# Patient Record
Sex: Male | Born: 1990 | Race: Black or African American | Hispanic: No | Marital: Single | Smoking: Current some day smoker
Health system: Southern US, Community
[De-identification: ages and names within clinical notes are randomized; demographics above are authoritative.]

## PROBLEM LIST (undated history)

## (undated) DIAGNOSIS — I10 Essential (primary) hypertension: Secondary | ICD-10-CM

## (undated) DIAGNOSIS — Z87891 Personal history of nicotine dependence: Secondary | ICD-10-CM

## (undated) HISTORY — DX: Personal history of nicotine dependence: Z87.891

---
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
## 2010-07-24 LAB — URINALYSIS, ROUTINE W REFLEX MICROSCOPIC
Bilirubin Urine: NEGATIVE
Glucose, UA: NEGATIVE mg/dL
Hgb urine dipstick: NEGATIVE
Ketones, ur: NEGATIVE mg/dL
Nitrite: NEGATIVE
Protein, ur: NEGATIVE mg/dL
Specific Gravity, Urine: 1.034 — ABNORMAL HIGH (ref 1.005–1.030)
Urobilinogen, UA: 0.2 mg/dL (ref 0.0–1.0)
pH: 5 (ref 5.0–8.0)

## 2010-07-24 LAB — CBC
HCT: 48.8 % (ref 39.0–52.0)
MCHC: 33.1 g/dL (ref 30.0–36.0)
MCV: 82.1 fL (ref 78.0–100.0)
RBC: 5.95 MIL/uL — ABNORMAL HIGH (ref 4.22–5.81)
WBC: 11.6 10*3/uL — ABNORMAL HIGH (ref 4.0–10.5)

## 2010-07-24 LAB — DIFFERENTIAL
Basophils Absolute: 0 10*3/uL (ref 0.0–0.1)
Eosinophils Relative: 0 % (ref 0–5)
Lymphocytes Relative: 4 % — ABNORMAL LOW (ref 12–46)
Neutro Abs: 10.7 10*3/uL — ABNORMAL HIGH (ref 1.7–7.7)
Neutrophils Relative %: 92 % — ABNORMAL HIGH (ref 43–77)

## 2010-07-24 LAB — LIPASE, BLOOD: Lipase: 14 U/L (ref 11–59)

## 2010-07-24 LAB — COMPREHENSIVE METABOLIC PANEL
BUN: 14 mg/dL (ref 6–23)
CO2: 27 mEq/L (ref 19–32)
Calcium: 9.3 mg/dL (ref 8.4–10.5)
Chloride: 102 mEq/L (ref 96–112)
Creatinine, Ser: 1.1 mg/dL (ref 0.4–1.5)
GFR calc non Af Amer: >60 mL/min (ref >60)
Glucose, Bld: 111 mg/dL — ABNORMAL HIGH (ref 70–99)
Total Bilirubin: 1.6 mg/dL — ABNORMAL HIGH (ref 0.3–1.2)

## 2010-07-26 LAB — POCT RAPID STREP A (OFFICE): Streptococcus, Group A Screen (Direct): NEGATIVE

## 2010-07-30 LAB — POCT RAPID STREP A (OFFICE): Streptococcus, Group A Screen (Direct): NEGATIVE

## 2010-09-24 NOTE — Op Note (Signed)
NAME:  HARLIS, CHAMPOUX       ACCOUNT NO.:  [id number]   MEDICAL RECORD NO.:  [id number]          PATIENT TYPE:  OBV   LOCATION:  2550                         FACILITY:  [facility]   PHYSICIAN:  Burnard Bunting, M.D.    DATE OF BIRTH:  August 26, 1990   DATE OF PROCEDURE:  07/08/2005  DATE OF DISCHARGE:                                 OPERATIVE REPORT   PREOPERATIVE DIAGNOSIS:  Left wrist Salter-Harris 3 fracture.   POSTOPERATIVE DIAGNOSIS:  Left wrist Salter-Harris 3 fracture.   PROCEDURE:  Left wrist closed reduction with percutaneous pinning.   SURGEON:  Burnard Bunting, M.D.   ASSISTANT:  None.   ANESTHESIA:  General.   ESTIMATED BLOOD LOSS:  2 cc.   DESCRIPTION OF PROCEDURE:  The patient was brought to the operating room  where general endotracheal anesthesia was induced and preoperative  antibiotics were administered.  The left arm was prepped with DuraPrep  solution and draped in a sterile manner.   Under one attempt, the distal radius fracture was reduced.  One 0.62 K-wire  was placed across the fracture site on a single attempt, with good reduction  achieved and confirmed in the AP and lateral planes under fluoroscopic  guidance.  The incision for the pin was made, and soft tissue dissection was  performed with the hemostat spreader.  The pin was then bent and cut above  the skin.  Bactroban cream was applied over the pin entry site.  A bulky  dressing and sugartong splint was applied.   The patient tolerated the procedure well, without immediate complications.           ______________________________  G. Dorene Grebe, M.D.     GSD/MEDQ  D:  07/08/2005  T:  07/10/2005  Job:  [id number]

## 2010-09-24 NOTE — H&P (Signed)
NAME:  Jesse, Reed       ACCOUNT NO.:  [id number]   MEDICAL RECORD NO.:  [id number]          PATIENT TYPE:  OBV   LOCATION:  2550                         FACILITY:  [facility]   PHYSICIAN:  Burnard Bunting, M.D.    DATE OF BIRTH:  1990/09/11   DATE OF ADMISSION:  07/08/2005  DATE OF DISCHARGE:  07/09/2005                                HISTORY & PHYSICAL   CHIEF COMPLAINT:  Left wrist pain.   HISTORY OF PRESENT ILLNESS:  Jesse Reed is a 20 year old right  hand dominant male who fell on his outstretched hand playing football today.  He reports left wrist pain.  Denies any other orthopedic complaints.  He has  been NPO since 3:45.   He has no known drug allergies.   No current medications.   PAST HISTORY:  Noncontributory.   He has a noncontributory family history.   No loss of consciousness, no problems with anesthesia in the past.   PHYSICAL EXAMINATION:  VITAL SIGNS:  Stable.  CHEST:  Clear to auscultation.  HEART:  Regular rhythm.  ABDOMEN:  Benign.  EXTREMITIES:  The patient does have increased body mass index.  Left elbow  and shoulder have full range of motion.  The left wrist compartments are  soft.  Radial pulse 2+ out of 4.  Sensation __________ in medial radial  ulnar distribution.  He does have decreased grip strength.   Radiographs demonstrate Salter-Harris III distal radius fracture.   IMPRESSION:  Left distal radius fracture.   PLAN:  Closed reduction, percutaneous pinning.  Risks and benefits are  discussed with the mother which include, but are not limited to, infection,  nonunion, malunion, growth plate disturbance, nerve and vessel damage, and  the need for more surgery.  All questions answered.           ______________________________  G. Dorene Grebe, M.D.     GSD/MEDQ  D:  07/08/2005  T:  07/09/2005  Job:  [id number]

## 2011-11-30 ENCOUNTER — Encounter (HOSPITAL_COMMUNITY): Payer: Self-pay | Admitting: Emergency Medicine

## 2011-11-30 ENCOUNTER — Emergency Department (HOSPITAL_COMMUNITY)
Admission: EM | Admit: 2011-11-30 | Discharge: 2011-11-30 | Disposition: A | Discharge status: Home / Self Care | Payer: Self-pay | Attending: Emergency Medicine | Admitting: Emergency Medicine

## 2011-11-30 DIAGNOSIS — F17200 Nicotine dependence, unspecified, uncomplicated: Secondary | ICD-10-CM | POA: Insufficient documentation

## 2011-11-30 DIAGNOSIS — J039 Acute tonsillitis, unspecified: Secondary | ICD-10-CM | POA: Insufficient documentation

## 2011-11-30 MED ORDER — HYDROCODONE-HOMATROPINE 5-1.5 MG/5ML PO SYRP: 5.0000 mL | ORAL_SOLUTION | Freq: Four times a day (QID) | ORAL | Status: AC | PRN

## 2011-11-30 MED ORDER — DEXAMETHASONE SODIUM PHOSPHATE 10 MG/ML IJ SOLN
10.0000 mg | Freq: Once | INTRAMUSCULAR | Status: AC
  Administered 2011-11-30: 10 mg via INTRAMUSCULAR
  Filled 2011-11-30: qty 1

## 2011-11-30 MED ORDER — PENICILLIN G BENZATHINE 1200000 UNIT/2ML IM SUSP
1.2000 10*6.[IU] | Freq: Once | INTRAMUSCULAR | Status: AC
  Administered 2011-11-30: 1.2 10*6.[IU] via INTRAMUSCULAR
  Filled 2011-11-30: qty 2

## 2011-11-30 NOTE — ED Notes (Signed)
pt reports sore throat and R ear pain onset this weekend

## 2011-11-30 NOTE — ED Provider Notes (Signed)
Medical screening examination/treatment/procedure(s) were performed by non-physician practitioner and as supervising physician I was immediately available for consultation/collaboration.  Sunnie Nielsen, MD 11/30/11 [phone]

## 2011-11-30 NOTE — ED Provider Notes (Signed)
History     CSN: [id number]  Arrival date & time 11/30/11  1478   First MD Initiated Contact with Patient 11/30/11 0522      Chief Complaint  Patient presents with  . Sore Throat  . Otalgia    (Consider location/radiation/quality/duration/timing/severity/associated sxs/prior treatment) HPI Comments: Patient presents emergency department with chief complaint of sore throat.  Onset of symptoms began yesterday and are interfering with the patient's ability to sleep tonight.  In addition he reports some right ear pain but denies any cough, fever, night sweats, chills, difficulty breathing, throat closing, inability to swallow secretions, voice change, stridor or drooling.  Pain severity is rated at a 7/10 and worsened by swallowing.  Patient is a 21 y.o. male presenting with pharyngitis and ear pain. The history is provided by the patient.  Sore Throat Associated symptoms include a sore throat. Pertinent negatives include no abdominal pain, chest pain, chills, congestion, coughing, diaphoresis, fatigue, fever, headaches, neck pain or rash.  Otalgia Associated symptoms include sore throat. Pertinent negatives include no headaches, no rhinorrhea, no abdominal pain, no neck pain, no cough and no rash.    History reviewed. No pertinent past medical history.  History reviewed. No pertinent past surgical history.  History reviewed. No pertinent family history.  History  Substance Use Topics  . Smoking status: Current Some Day Smoker    Types: Cigars  . Smokeless tobacco: Not on file  . Alcohol Use: No      Review of Systems  Constitutional: Negative for fever, chills, diaphoresis, activity change, appetite change and fatigue.  HENT: Positive for ear pain, sore throat and trouble swallowing. Negative for congestion, facial swelling, rhinorrhea, sneezing, drooling, neck pain, neck stiffness, dental problem, voice change, postnasal drip and sinus pressure.   Eyes: Positive for visual  disturbance.  Respiratory: Negative for cough, choking, shortness of breath, wheezing and stridor.   Cardiovascular: Negative for chest pain.  Gastrointestinal: Negative for abdominal pain.  Skin: Negative for rash.  Neurological: Negative for dizziness and headaches.  Hematological: Positive for adenopathy. Does not bruise/bleed easily.    Allergies  Review of patient's allergies indicates no known allergies.  Home Medications   Current Outpatient Rx  Name Route Sig Dispense Refill  . ADULT MULTIVITAMIN W/MINERALS CH Oral Take 1 tablet by mouth daily.      BP 175/97  Pulse 99  Temp 100.1 F (37.8 C) (Oral)  Resp 17  SpO2 97%  Physical Exam  Nursing note and vitals reviewed. Constitutional: He is oriented to person, place, and time. He appears well-developed and well-nourished. No distress.  HENT:  Head: Normocephalic and atraumatic. No trismus in the jaw.  Right Ear: Tympanic membrane, external ear and ear canal normal.  Left Ear: Tympanic membrane, external ear and ear canal normal.  Nose: Nose normal. No rhinorrhea. Right sinus exhibits no maxillary sinus tenderness and no frontal sinus tenderness. Left sinus exhibits no maxillary sinus tenderness and no frontal sinus tenderness.  Mouth/Throat: Uvula is midline and mucous membranes are normal. Normal dentition. No dental abscesses or uvula swelling. Posterior oropharyngeal edema present. No oropharyngeal exudate, posterior oropharyngeal erythema or tonsillar abscesses.       No submental edema, tongue not elevated, no trismus. No impending airway obstruction; Pt able to speak full sentences, swallow intact, no drooling, stridor, or tonsillar/uvula displacement. No palatal petechia  Eyes: Conjunctivae are normal.  Neck: Trachea normal, normal range of motion and full passive range of motion without pain. Neck supple. No rigidity. Erythema  present. Normal range of motion present. No Brudzinski's sign noted.       Flexion and  extension of neck without pain or difficulty. Able to breath without difficulty in extension.  Cardiovascular: Normal rate and regular rhythm.   Pulmonary/Chest: Effort normal and breath sounds normal. No stridor. No respiratory distress. He has no wheezes.  Abdominal: Soft. There is no tenderness.       No obvious evidence of splenomegaly. Non ttp.   Musculoskeletal: Normal range of motion.  Lymphadenopathy:       Head (right side): No preauricular and no posterior auricular adenopathy present.       Head (left side): No preauricular and no posterior auricular adenopathy present.    He has cervical adenopathy.  Neurological: He is alert and oriented to person, place, and time.  Skin: Skin is warm and dry. No rash noted. He is not diaphoretic.  Psychiatric: He has a normal mood and affect.    ED Course  Procedures (including critical care time)  Labs Reviewed - No data to display No results found.   No diagnosis found.    MDM  Tonsillitis  Patient she did with Decadron and penicillin while in the emergency department.  He'll be discharged with symptomatic treatment. Pt afebrile without tonsillar exudate, treated with pcn. Presents with mild cervical lymphadenopathy, & dysphagia. DC w symptomatic tx for pain  Pt does not appear dehydrated, but did discuss importance of water rehydration. Presentation non concerning for PTA or infxn spread to soft tissue. No trismus or uvula deviation. Specific return precautions discussed. Pt able to drink water in ED without difficulty with intact air way. Recommended PCP follow up.         Jaci Carrel, [place] 11/30/11 [phone]

## 2013-04-15 ENCOUNTER — Encounter (HOSPITAL_COMMUNITY): Payer: Self-pay | Admitting: Emergency Medicine

## 2013-04-15 ENCOUNTER — Emergency Department (INDEPENDENT_AMBULATORY_CARE_PROVIDER_SITE_OTHER)
Admission: EM | Admit: 2013-04-15 | Discharge: 2013-04-15 | Disposition: A | Discharge status: Home / Self Care | Payer: 59 | Source: Home / Self Care | Attending: Family Medicine | Admitting: Family Medicine

## 2013-04-15 DIAGNOSIS — S60229A Contusion of unspecified hand, initial encounter: Secondary | ICD-10-CM

## 2013-04-15 DIAGNOSIS — S60221A Contusion of right hand, initial encounter: Secondary | ICD-10-CM

## 2013-04-15 NOTE — ED Provider Notes (Signed)
CSN: [id number]     Arrival date & time 04/15/13  1948 History   First MD Initiated Contact with Patient 04/15/13 2014     Chief Complaint  Patient presents with  . Hand Pain   (Consider location/radiation/quality/duration/timing/severity/associated sxs/prior Treatment) Patient is a 22 y.o. male presenting with hand pain. The history is provided by the patient.  Hand Pain This is a new problem. The current episode started 2 days ago. The problem has not changed since onset.Associated symptoms comments: Lifts heavy furniture at work, is right handed, and feels hand is sore from work activity. NKI.Marland Kitchen    History reviewed. No pertinent past medical history. History reviewed. No pertinent past surgical history. No family history on file. History  Substance Use Topics  . Smoking status: Current Some Day Smoker    Types: Cigars  . Smokeless tobacco: Not on file  . Alcohol Use: No    Review of Systems  Constitutional: Negative.   Musculoskeletal: Positive for myalgias. Negative for arthralgias, back pain and joint swelling.  Skin: Negative for rash and wound.    Allergies  Review of patient's allergies indicates no known allergies.  Home Medications   Current Outpatient Rx  Name  Route  Sig  Dispense  Refill  . Multiple Vitamin (MULTIVITAMIN WITH MINERALS) TABS   Oral   Take 1 tablet by mouth daily.          BP 137/77  Pulse 86  Temp(Src) 97.8 F (36.6 C) (Oral)  Resp 14  SpO2 98% Physical Exam  Nursing note and vitals reviewed. Constitutional: He is oriented to person, place, and time. He appears well-developed and well-nourished.  Musculoskeletal: He exhibits tenderness.       Right hand: He exhibits tenderness. He exhibits normal range of motion, no bony tenderness, normal two-point discrimination, normal capillary refill, no deformity and no swelling.       Hands: Neurological: He is alert and oriented to person, place, and time.  Skin: Skin is warm and dry.     ED Course  Procedures (including critical care time) Labs Review Labs Reviewed - No data to display Imaging Review No results found.  EKG Interpretation    Date/Time:    Ventricular Rate:    PR Interval:    QRS Duration:   QT Interval:    QTC Calculation:   R Axis:     Text Interpretation:              MDM   1. Contusion, hand, right, initial encounter        Linna Hoff, MD 04/15/13 2028

## 2013-04-15 NOTE — ED Notes (Signed)
Right hand pain that started 2 days ago.  Reports sharp pain, no known injury.  Pain is in the middle of the palm of the hand

## 2014-01-05 ENCOUNTER — Emergency Department (HOSPITAL_COMMUNITY): Payer: 59

## 2014-01-05 ENCOUNTER — Encounter (HOSPITAL_COMMUNITY): Payer: Self-pay | Admitting: Emergency Medicine

## 2014-01-05 ENCOUNTER — Emergency Department (HOSPITAL_COMMUNITY)
Admission: EM | Admit: 2014-01-05 | Discharge: 2014-01-05 | Disposition: A | Discharge status: Home / Self Care | Payer: 59 | Attending: Emergency Medicine | Admitting: Emergency Medicine

## 2014-01-05 DIAGNOSIS — S60042A Contusion of left ring finger without damage to nail, initial encounter: Secondary | ICD-10-CM

## 2014-01-05 DIAGNOSIS — S6980XA Other specified injuries of unspecified wrist, hand and finger(s), initial encounter: Secondary | ICD-10-CM | POA: Diagnosis present

## 2014-01-05 DIAGNOSIS — Y9361 Activity, american tackle football: Secondary | ICD-10-CM | POA: Insufficient documentation

## 2014-01-05 DIAGNOSIS — W219XXA Striking against or struck by unspecified sports equipment, initial encounter: Secondary | ICD-10-CM | POA: Insufficient documentation

## 2014-01-05 DIAGNOSIS — S6000XA Contusion of unspecified finger without damage to nail, initial encounter: Secondary | ICD-10-CM | POA: Diagnosis not present

## 2014-01-05 DIAGNOSIS — Y92838 Other recreation area as the place of occurrence of the external cause: Secondary | ICD-10-CM

## 2014-01-05 DIAGNOSIS — F17200 Nicotine dependence, unspecified, uncomplicated: Secondary | ICD-10-CM | POA: Diagnosis not present

## 2014-01-05 DIAGNOSIS — S6990XA Unspecified injury of unspecified wrist, hand and finger(s), initial encounter: Secondary | ICD-10-CM | POA: Insufficient documentation

## 2014-01-05 DIAGNOSIS — Y9239 Other specified sports and athletic area as the place of occurrence of the external cause: Secondary | ICD-10-CM | POA: Diagnosis not present

## 2014-01-05 MED ORDER — HYDROCODONE-ACETAMINOPHEN 5-325 MG PO TABS: 1.0000 | ORAL_TABLET | Freq: Four times a day (QID) | ORAL | Status: DC | PRN

## 2014-01-05 MED ORDER — NAPROXEN 500 MG PO TABS: 500.0000 mg | ORAL_TABLET | Freq: Two times a day (BID) | ORAL | Status: DC | PRN

## 2014-01-05 NOTE — ED Provider Notes (Signed)
Medical screening examination/treatment/procedure(s) were performed by non-physician practitioner and as supervising physician I was immediately available for consultation/collaboration.   EKG Interpretation None        Shantika Bermea J. Alexza Norbeck, MD 01/05/14 2336 

## 2014-01-05 NOTE — ED Provider Notes (Signed)
CSN: [id number]     Arrival date & time 01/05/14  1500 History  This chart was scribed for Jesse Gourd, PA-C, working with Gilda Crease, * by Chestine Spore, ED Scribe. The patient was seen in room TR06C/TR06C at 3:15 PM.    Chief Complaint  Patient presents with  . Finger Injury     The history is provided by the patient. No language interpreter was used.   HPI Comments: Jesse Reed is a 23 y.o. male who presents to the Emergency Department complaining of a left ring finger injury onset yesterday. He rates his pain as a 7/10.He states that someone stepped on his right hand while playing football. He does not know if he fractured it. He can not sleep because of the pain. Pt is able to pick things up but it hurts with movement. He is not able to bend his finger. He applied tape to his finger "just because." He denies any other associated symptoms.  History reviewed. No pertinent past medical history. History reviewed. No pertinent past surgical history. History reviewed. No pertinent family history. History  Substance Use Topics  . Smoking status: Current Some Day Smoker    Types: Cigars  . Smokeless tobacco: Not on file  . Alcohol Use: No    Review of Systems  Musculoskeletal: Positive for arthralgias (left ring finger).    A complete 10 system review of systems was obtained and all systems are negative except as noted in the HPI and PMH.    Allergies  Review of patient's allergies indicates no known allergies.  Home Medications   Prior to Admission medications   Medication Sig Start Date End Date Taking? Authorizing Provider  HYDROcodone-acetaminophen (NORCO) 5-325 MG per tablet Take 1-2 tablets by mouth every 6 (six) hours as needed for severe pain. 01/05/14   Mercedes Strupp Camprubi-Soms, PA-C  naproxen (NAPROSYN) 500 MG tablet Take 1 tablet (500 mg total) by mouth 2 (two) times daily as needed for mild pain, moderate pain or headache (TAKE WITH MEALS.).  01/05/14   Mercedes Strupp Camprubi-Soms, PA-C   BP 153/77  Pulse 72  Temp(Src) 98.4 F (36.9 C) (Oral)  Resp 18  SpO2 99%  Physical Exam  Nursing note and vitals reviewed. Constitutional: He is oriented to person, place, and time. He appears well-developed and well-nourished. No distress.  HENT:  Head: Normocephalic and atraumatic.  Eyes: Conjunctivae and EOM are normal.  Neck: Normal range of motion. Neck supple.  Cardiovascular: Normal rate, regular rhythm and normal heart sounds.   Pulmonary/Chest: Effort normal and breath sounds normal.  Musculoskeletal: Normal range of motion. He exhibits tenderness. He exhibits no edema.  Tender to palpation over left ring finger PIP with mild swelling. Full flexion and extension at PIP joint. No evidence of tendon disruption. Cap refill less than 3 seconds.   Neurological: He is alert and oriented to person, place, and time.  Skin: Skin is warm and dry.  Psychiatric: He has a normal mood and affect. His behavior is normal.    ED Course  Procedures (including critical care time) DIAGNOSTIC STUDIES: Oxygen Saturation is 99% on room air, normal by my interpretation.    COORDINATION OF CARE: 3:19 PM-Discussed treatment plan which includes X-ray of left ring finger with pt at bedside and pt agreed to plan.   Labs Review Labs Reviewed - No data to display  Imaging Review No results found.   EKG Interpretation None      MDM   Final diagnoses:  Contusion of left ring finger, initial encounter    Neurovascularly intact. Xray without acute findings. RICE, NSAIDs. Stable for d/c. Return precautions given. Patient states understanding of treatment care plan and is agreeable.  I personally performed the services described in this documentation, which was scribed in my presence. The recorded information has been reviewed and is accurate.    Trevor Mace, PA-C 01/21/14 [phone]

## 2014-01-05 NOTE — Discharge Instructions (Signed)
Use ice for at a time over the next 24hrs, then move to heat. Buddy tape your finger for activities as needed to help with pain. Use naprosyn and norco as needed for pain. Return to your regular doctor in 1-2 weeks for reassessment. Return to the ER for any changes or worsening symptoms.   Contusion A contusion is a deep bruise. Contusions happen when an injury causes bleeding under the skin. Signs of bruising include pain, puffiness (swelling), and discolored skin. The contusion may turn blue, purple, or yellow. HOME CARE   Put ice on the injured area.  Put ice in a plastic bag.  Place a towel between your skin and the bag.  Leave the ice on for 15-20 minutes, 03-04 times a day.  Only take medicine as told by your doctor.  Rest the injured area.  If possible, raise (elevate) the injured area to lessen puffiness. GET HELP RIGHT AWAY IF:   You have more bruising or puffiness.  You have pain that is getting worse.  Your puffiness or pain is not helped by medicine. MAKE SURE YOU:   Understand these instructions.  Will watch your condition.  Will get help right away if you are not doing well or get worse. Document Released: 10/12/2007 Document Revised: 07/18/2011 Document Reviewed: 02/28/2011 [facility] Patient Information 2015 [place], [place]. This information is not intended to replace advice given to you by your health care provider. Make sure you discuss any questions you have with your health care provider.  Cryotherapy Cryotherapy is when you put ice on your injury. Ice helps lessen pain and puffiness (swelling) after an injury. Ice works the best when you start using it in the first 24 to 48 hours after an injury. HOME CARE  Put a dry or damp towel between the ice pack and your skin.  You may press gently on the ice pack.  Leave the ice on for no more than 10 to 20 minutes at a time.  Check your skin after 5 minutes to make sure your skin is okay.  Rest at  least 20 minutes between ice pack uses.  Stop using ice when your skin loses feeling (numbness).  Do not use ice on someone who cannot tell you when it hurts. This includes small children and people with memory problems (dementia). GET HELP RIGHT AWAY IF:  You have white spots on your skin.  Your skin turns blue or pale.  Your skin feels waxy or hard.  Your puffiness gets worse. MAKE SURE YOU:   Understand these instructions.  Will watch your condition.  Will get help right away if you are not doing well or get worse. Document Released: 10/12/2007 Document Revised: 07/18/2011 Document Reviewed: 12/16/2010 [facility] Patient Information 2015 [place], [place]. This information is not intended to replace advice given to you by your health care provider. Make sure you discuss any questions you have with your health care provider.

## 2014-01-05 NOTE — ED Provider Notes (Signed)
Care assumed from [facility]. Xray pending Physical Exam  BP 153/77  Pulse 72  Temp(Src) 98.4 F (36.9 C) (Oral)  Resp 18  SpO2 99%  Physical Exam Gen: afebrile, VSS, NAD HEENT: EOMI, MMM Resp: no resp distress CV: rate WNL Abd: appearance normal, nondistended MsK: moving all extremities with ease, somewhat limited ROM at PIP of L 4th finger secondary to pain with full extension but not as much with full flexion, with slight crepitus upon passive full extension but minimally tender and no effusion or swelling. NV intact Neuro: A&O x4  ED Course  Procedures Dg Finger Ring Left  01/05/2014   CLINICAL DATA:  Injury.  EXAM: LEFT RING FINGER 2+V  COMPARISON:  None.  FINDINGS: No radiopaque foreign bodies. No soft tissue swelling. No acute bony or joint abnormality.  IMPRESSION: No acute abnormality .   Electronically Signed   By: Maisie Fus  Register   On: 01/05/2014 16:15     MDM No fx, neurovascularly intact digit. Buddy taping for activities, discussed RICE therapy and gradual return to regular sports. F/up with PCP in 2 wks. Rx for naprosyn and percocet given. I explained the diagnosis and have given explicit precautions to return to the ER including for any other new or worsening symptoms. The patient understands and accepts the medical plan as it's been dictated and I have answered their questions. Discharge instructions concerning home care and prescriptions have been given. The patient is STABLE and is discharged to home in good condition.  BP 153/77  Pulse 72  Temp(Src) 98.4 F (36.9 C) (Oral)  Resp 18  SpO2 99%  Meds ordered this encounter  Medications  . HYDROcodone-acetaminophen (NORCO) 5-325 MG per tablet    Sig: Take 1-2 tablets by mouth every 6 (six) hours as needed for severe pain.    Dispense:  6 tablet    Refill:  0    Order Specific Question:  Supervising Provider    Answer:  Eber Hong D [[id number]]  . naproxen (NAPROSYN) 500 MG tablet    Sig: Take 1 tablet  (500 mg total) by mouth 2 (two) times daily as needed for mild pain, moderate pain or headache (TAKE WITH MEALS.).    Dispense:  20 tablet    Refill:  0    Order Specific Question:  Supervising Provider    Answer:  Vida Roller [address] Camprubi-Soms, PA-C 01/05/14 [phone]

## 2014-01-05 NOTE — ED Notes (Signed)
Declined W/C at D/C and was escorted to lobby by RN. 

## 2014-01-05 NOTE — ED Notes (Signed)
Pt reports having someone step on his right hand yesterday while playing football. Has pain to right middle finger.

## 2014-01-15 ENCOUNTER — Emergency Department (HOSPITAL_COMMUNITY)
Admission: EM | Admit: 2014-01-15 | Discharge: 2014-01-15 | Disposition: A | Discharge status: Home / Self Care | Payer: 59 | Attending: Emergency Medicine | Admitting: Emergency Medicine

## 2014-01-15 ENCOUNTER — Encounter (HOSPITAL_COMMUNITY): Payer: Self-pay | Admitting: Emergency Medicine

## 2014-01-15 DIAGNOSIS — F17200 Nicotine dependence, unspecified, uncomplicated: Secondary | ICD-10-CM | POA: Diagnosis not present

## 2014-01-15 DIAGNOSIS — J029 Acute pharyngitis, unspecified: Secondary | ICD-10-CM | POA: Insufficient documentation

## 2014-01-15 LAB — RAPID STREP SCREEN (MED CTR MEBANE ONLY): Streptococcus, Group A Screen (Direct): NEGATIVE

## 2014-01-15 NOTE — ED Provider Notes (Signed)
CSN: [id number]     Arrival date & time 01/15/14  1052 History  This chart was scribed for non-physician practitioner, Roxy Horseman, PA-C working with Vanetta Mulders, MD by Greggory Stallion, ED scribe. This patient was seen in room TR09C/TR09C and the patient's care was started at 11:26 AM.   Chief Complaint  Patient presents with  . Sore Throat  . Nasal Congestion   The history is provided by the patient. No language interpreter was used.   HPI Comments: Jesse Reed is a 23 y.o. male who presents to the Emergency Department complaining of gradual onset sore throat that started 4 days ago. Reports associated cough, rhinorrhea and congestion. Denies fever. He has not taken anything to alleviate his symptoms. There are no aggravating or alleviating factors.  History reviewed. No pertinent past medical history. History reviewed. No pertinent past surgical history. History reviewed. No pertinent family history. History  Substance Use Topics  . Smoking status: Current Some Day Smoker    Types: Cigars  . Smokeless tobacco: Not on file  . Alcohol Use: No    Review of Systems  Constitutional: Negative for fever.  HENT: Positive for congestion, rhinorrhea and sore throat.   Eyes: Negative for redness.  Respiratory: Positive for cough.   Cardiovascular: Negative for chest pain.  Gastrointestinal: Negative for abdominal distention.  Musculoskeletal: Negative for gait problem.  Skin: Negative for rash.  Neurological: Negative for speech difficulty.  Psychiatric/Behavioral: Negative for confusion.   Allergies  Review of patient's allergies indicates no known allergies.  Home Medications   Prior to Admission medications   Medication Sig Start Date End Date Taking? Authorizing Provider  HYDROcodone-acetaminophen (NORCO) 5-325 MG per tablet Take 1-2 tablets by mouth every 6 (six) hours as needed for severe pain. 01/05/14   Mercedes Strupp Camprubi-Soms, PA-C  naproxen (NAPROSYN)  500 MG tablet Take 1 tablet (500 mg total) by mouth 2 (two) times daily as needed for mild pain, moderate pain or headache (TAKE WITH MEALS.). 01/05/14   Mercedes Strupp Camprubi-Soms, PA-C   BP 143/85  Pulse 75  Temp(Src) 98.9 F (37.2 C) (Oral)  Resp 18  SpO2 96%  Physical Exam  Nursing note and vitals reviewed. Constitutional: He is oriented to person, place, and time. He appears well-developed. No distress.  HENT:  Head: Normocephalic and atraumatic.  Right Ear: Tympanic membrane and ear canal normal.  Left Ear: Tympanic membrane and ear canal normal.  Bilateral TMs normal. Oropharynx mildly erythematous. No tonsillar exudate. No abscess. Airway is intact.   Eyes: Conjunctivae and EOM are normal.  Cardiovascular: Normal rate, regular rhythm and normal heart sounds.  Exam reveals no gallop and no friction rub.   No murmur heard. Pulmonary/Chest: Effort normal and breath sounds normal. No stridor. No respiratory distress. He has no wheezes. He has no rhonchi. He has no rales.  Abdominal: He exhibits no distension.  Musculoskeletal: He exhibits no edema.  Neurological: He is alert and oriented to person, place, and time.  Skin: Skin is warm and dry.  Psychiatric: He has a normal mood and affect.    ED Course  Procedures (including critical care time)  DIAGNOSTIC STUDIES: Oxygen Saturation is 96% on RA, normal by my interpretation.    COORDINATION OF CARE: 11:28 AM-Discussed treatment plan which includes raid strep test with pt at bedside and pt agreed to plan. Advised pt that if test is positive, he will be discharged home with an antibiotic but if it is negative, he will be given symptomatic  treatment.   Results for orders placed during the hospital encounter of 01/15/14  RAPID STREP SCREEN      Result Value Ref Range   Streptococcus, Group A Screen (Direct) NEGATIVE  NEGATIVE     Imaging Review No results found.   EKG Interpretation None      MDM   Final  diagnoses:  Pharyngitis    Pt afebrile without tonsillar exudate, negative strep. Presents with mild cervical lymphadenopathy, & dysphagia; diagnosis of viral pharyngitis. No abx indicated. DC w symptomatic tx for pain  Pt does not appear dehydrated, but did discuss importance of water rehydration. Presentation non concerning for PTA or infxn spread to soft tissue. No trismus or uvula deviation. Specific return precautions discussed. Pt able to drink water in ED without difficulty with intact air way. Recommended PCP follow up.   I personally performed the services described in this documentation, which was scribed in my presence. The recorded information has been reviewed and is accurate.  Roxy Horseman, PA-C 01/15/14 1652

## 2014-01-15 NOTE — ED Notes (Signed)
Pt c/o sore throat and nasal congestion with cough x 4 days; pt denies fever

## 2014-01-15 NOTE — Discharge Instructions (Signed)

## 2014-01-16 NOTE — ED Provider Notes (Signed)
Medical screening examination/treatment/procedure(s) were performed by non-physician practitioner and as supervising physician I was immediately available for consultation/collaboration.   EKG Interpretation None        Vanetta Mulders, MD 01/16/14 [phone]

## 2014-01-17 LAB — CULTURE, GROUP A STREP

## 2014-01-22 NOTE — ED Provider Notes (Signed)
Medical screening examination/treatment/procedure(s) were performed by non-physician practitioner and as supervising physician I was immediately available for consultation/collaboration.   EKG Interpretation None        Gilda Crease, MD 01/22/14 [phone]

## 2015-01-08 IMAGING — CR DG FINGER RING 2+V*L*
3 series · 3 of 3 positions shown · non-contrast
Comparison: None.

CLINICAL DATA: Injury.

EXAM:
LEFT RING FINGER 2+V

[x finger pa left]
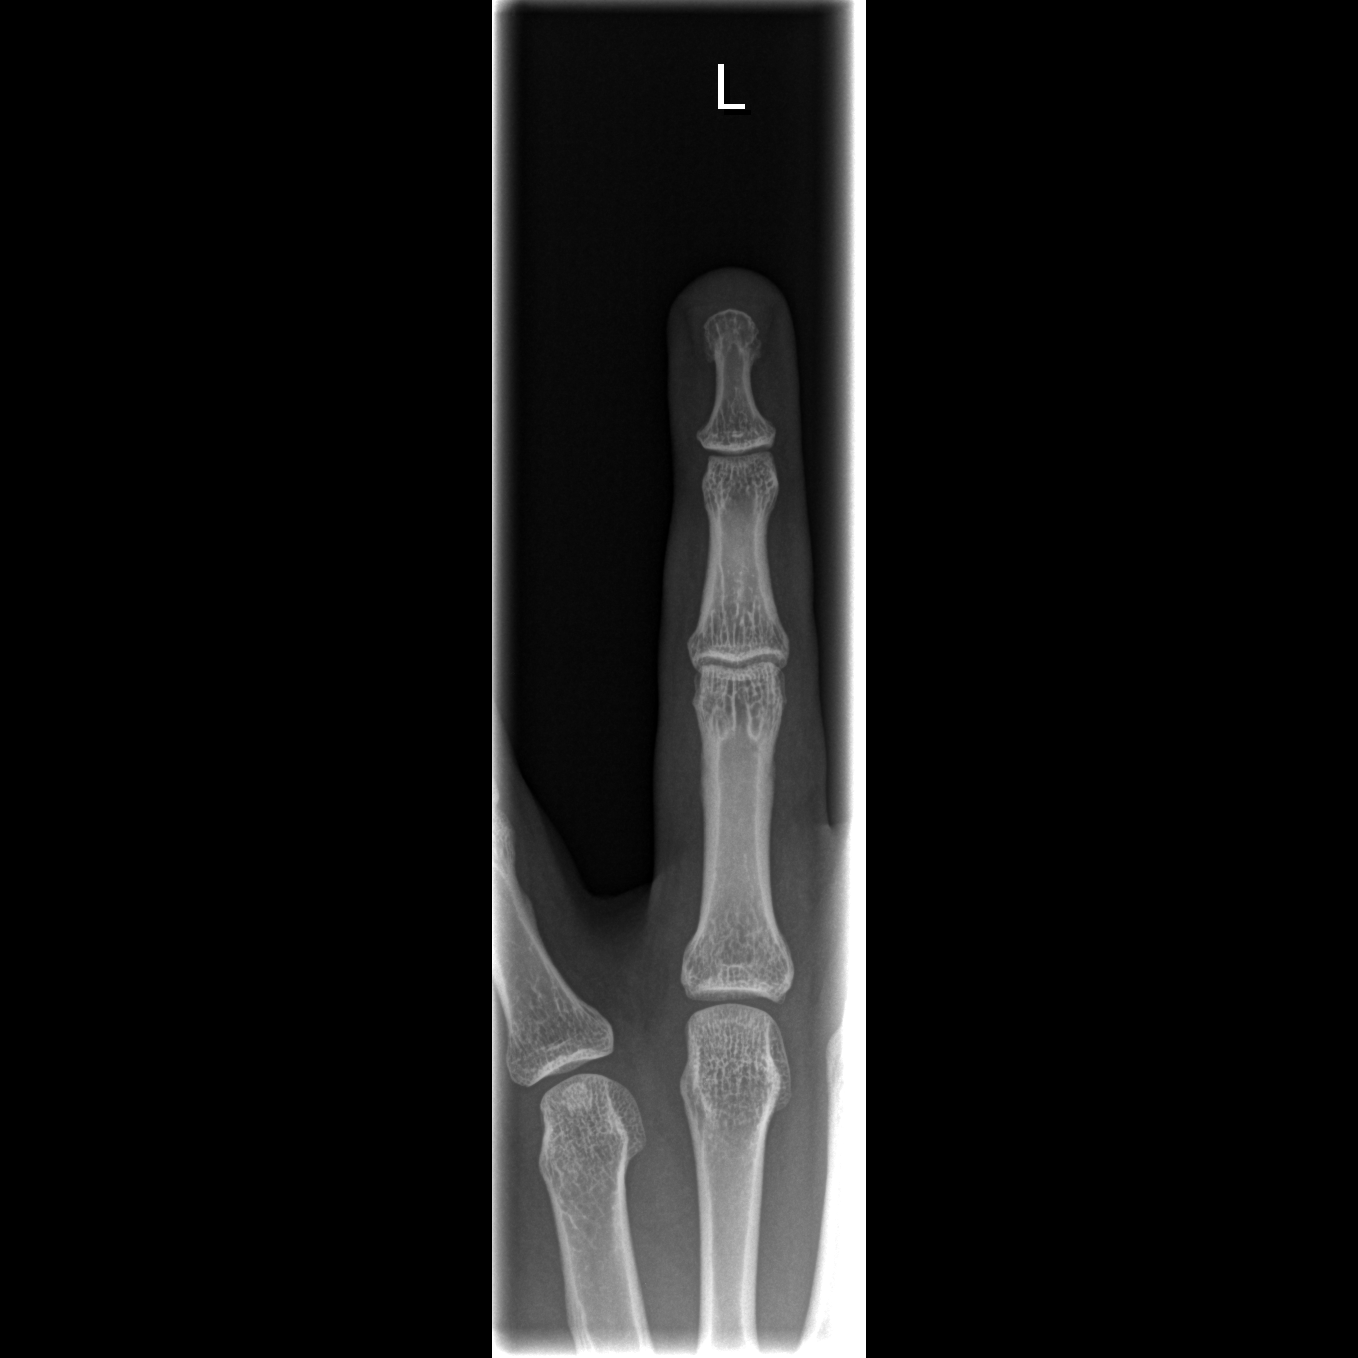

[x finger obl. left]
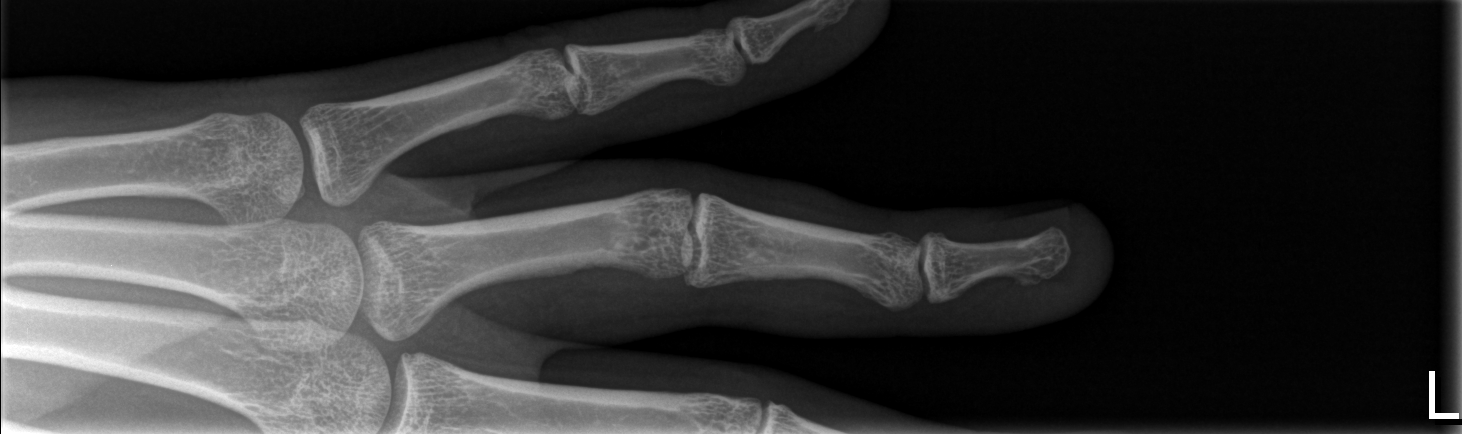

[x finger lateral left]
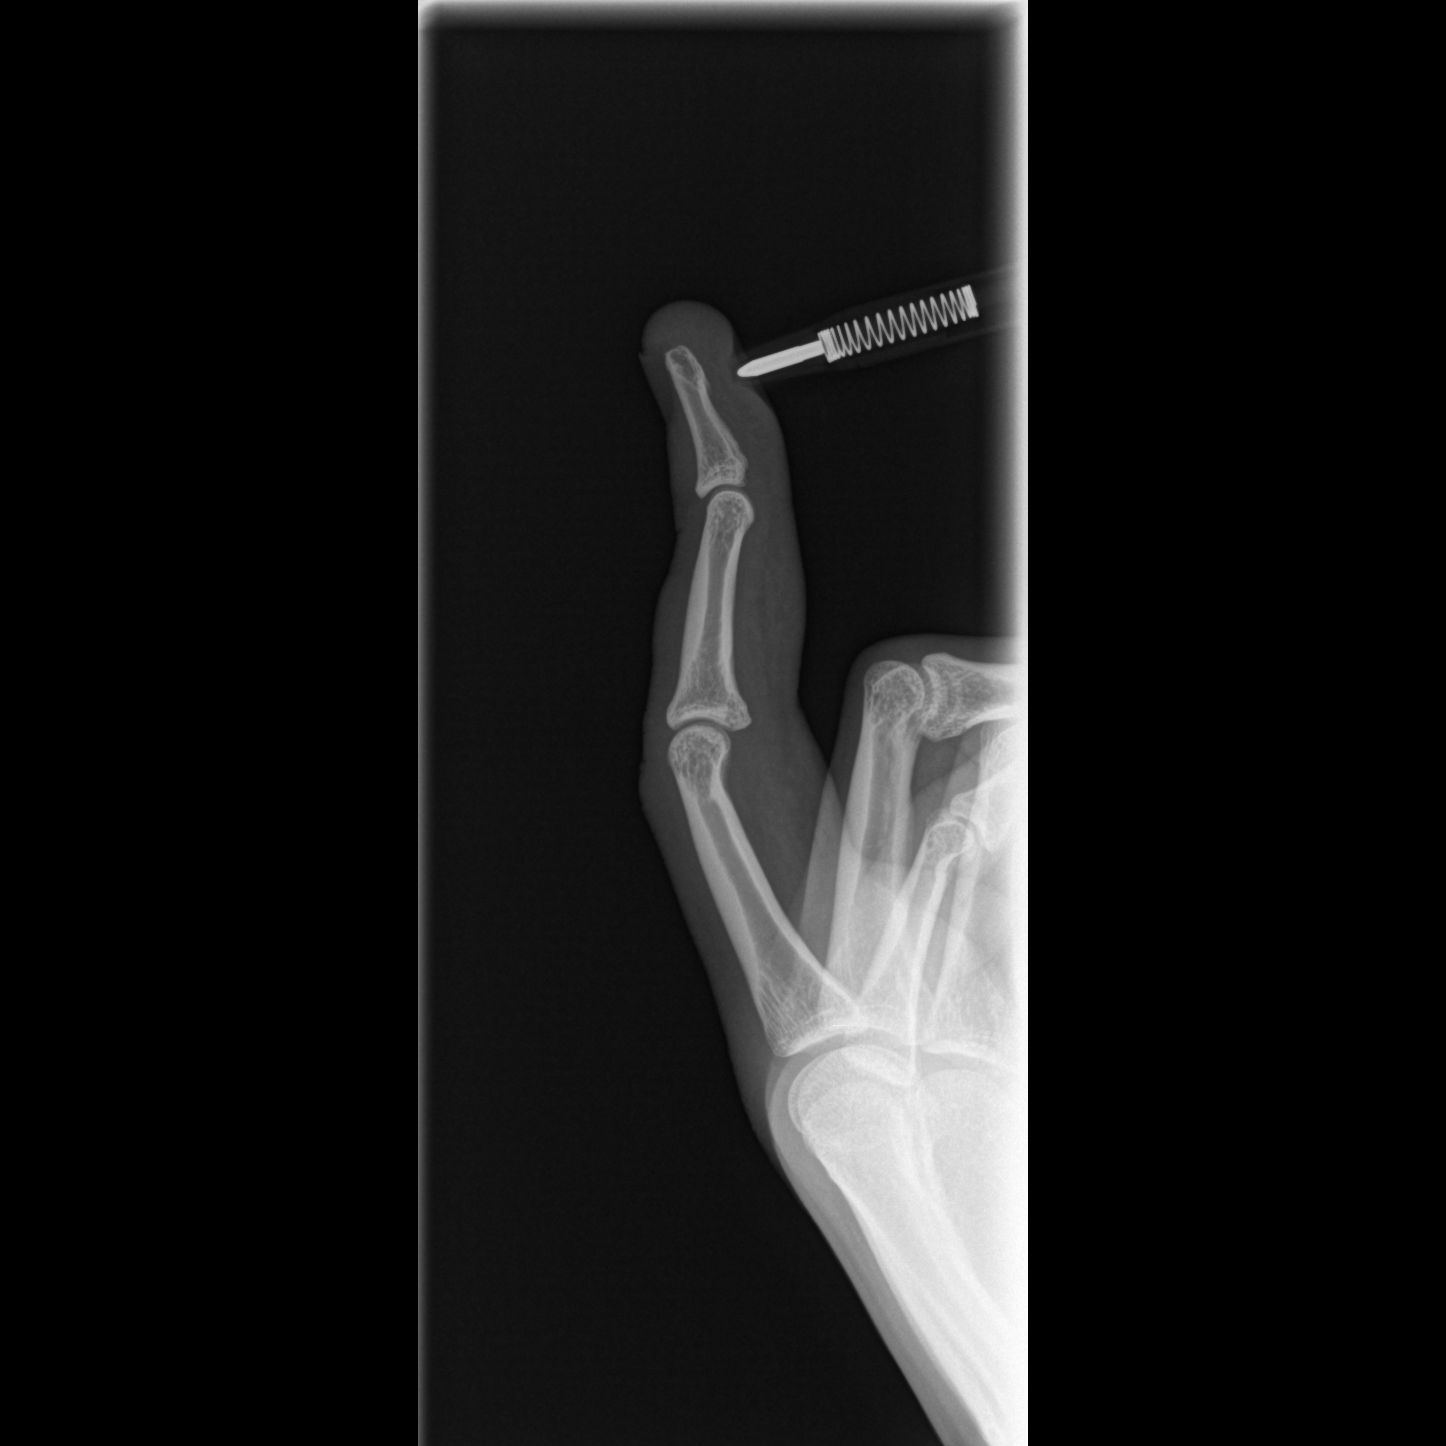

[3 of 3 positions shown; findings below may reference images not displayed]

FINDINGS: No radiopaque foreign bodies. No soft tissue swelling. No acute bony
or joint abnormality.
IMPRESSION: No acute abnormality .

## 2015-01-30 ENCOUNTER — Encounter (HOSPITAL_COMMUNITY): Payer: Self-pay | Admitting: *Deleted

## 2015-01-30 ENCOUNTER — Emergency Department (HOSPITAL_COMMUNITY)
Admission: EM | Admit: 2015-01-30 | Discharge: 2015-01-30 | Disposition: A | Discharge status: Home / Self Care | Payer: No Typology Code available for payment source | Attending: Emergency Medicine | Admitting: Emergency Medicine

## 2015-01-30 DIAGNOSIS — Y9389 Activity, other specified: Secondary | ICD-10-CM | POA: Insufficient documentation

## 2015-01-30 DIAGNOSIS — S299XXA Unspecified injury of thorax, initial encounter: Secondary | ICD-10-CM | POA: Insufficient documentation

## 2015-01-30 DIAGNOSIS — Y999 Unspecified external cause status: Secondary | ICD-10-CM | POA: Diagnosis not present

## 2015-01-30 DIAGNOSIS — S0990XA Unspecified injury of head, initial encounter: Secondary | ICD-10-CM | POA: Diagnosis not present

## 2015-01-30 DIAGNOSIS — Z720 Tobacco use: Secondary | ICD-10-CM | POA: Insufficient documentation

## 2015-01-30 DIAGNOSIS — Y92410 Unspecified street and highway as the place of occurrence of the external cause: Secondary | ICD-10-CM | POA: Insufficient documentation

## 2015-01-30 MED ORDER — IBUPROFEN 800 MG PO TABS: 800.0000 mg | ORAL_TABLET | Freq: Three times a day (TID) | ORAL | Status: DC

## 2015-01-30 MED ORDER — METHOCARBAMOL 500 MG PO TABS: 500.0000 mg | ORAL_TABLET | Freq: Two times a day (BID) | ORAL | Status: DC

## 2015-01-30 MED ORDER — IBUPROFEN 400 MG PO TABS
800.0000 mg | ORAL_TABLET | Freq: Once | ORAL | Status: AC
  Administered 2015-01-30: 800 mg via ORAL
  Filled 2015-01-30: qty 2
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
## 2015-01-30 NOTE — Discharge Instructions (Signed)
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
## 2015-05-25 ENCOUNTER — Emergency Department (INDEPENDENT_AMBULATORY_CARE_PROVIDER_SITE_OTHER)
Admission: EM | Admit: 2015-05-25 | Discharge: 2015-05-25 | Disposition: A | Discharge status: Home / Self Care | Payer: 59 | Source: Home / Self Care | Attending: Family Medicine | Admitting: Family Medicine

## 2015-05-25 ENCOUNTER — Encounter (HOSPITAL_COMMUNITY): Payer: Self-pay | Admitting: *Deleted

## 2015-05-25 DIAGNOSIS — J020 Streptococcal pharyngitis: Secondary | ICD-10-CM

## 2015-05-25 MED ORDER — AMOXICILLIN 500 MG PO CAPS: 500.0000 mg | ORAL_CAPSULE | Freq: Three times a day (TID) | ORAL | Status: DC

## 2015-05-25 NOTE — ED Provider Notes (Signed)
CSN: [id number]     Arrival date & time 05/25/15  1802 History   First MD Initiated Contact with Patient 05/25/15 1900     Chief Complaint  Patient presents with  . Sore Throat   (Consider location/radiation/quality/duration/timing/severity/associated sxs/prior Treatment) Patient is a 25 y.o. male presenting with pharyngitis. The history is provided by the patient.  Sore Throat This is a new problem. The current episode started more than 2 days ago. The problem has been gradually worsening. Pertinent negatives include no chest pain, no abdominal pain, no headaches and no shortness of breath. The symptoms are aggravated by swallowing.    History reviewed. No pertinent past medical history. History reviewed. No pertinent past surgical history. History reviewed. No pertinent family history. Social History  Substance Use Topics  . Smoking status: Current Some Day Smoker    Types: Cigars  . Smokeless tobacco: None  . Alcohol Use: No    Review of Systems  Constitutional: Positive for fever, chills and appetite change.  HENT: Positive for sore throat and trouble swallowing. Negative for rhinorrhea.   Respiratory: Negative.  Negative for shortness of breath.   Cardiovascular: Negative for chest pain.  Gastrointestinal: Negative.  Negative for abdominal pain.  Neurological: Negative for headaches.  Hematological: Positive for adenopathy.    Allergies  Review of patient's allergies indicates no known allergies.  Home Medications   Prior to Admission medications   Medication Sig Start Date End Date Taking? Authorizing Provider  amoxicillin (AMOXIL) 500 MG capsule Take 1 capsule (500 mg total) by mouth 3 (three) times daily. 05/25/15   Linna HoffJames D Arayla Kruschke, MD  HYDROcodone-acetaminophen (NORCO) 5-325 MG per tablet Take 1-2 tablets by mouth every 6 (six) hours as needed for severe pain. 01/05/14   Mercedes Camprubi-Soms, PA-C  ibuprofen (ADVIL,MOTRIN) 800 MG tablet Take 1 tablet (800 mg total)  by mouth 3 (three) times daily. 01/30/15   Fayrene HelperBowie Tran, PA-C  methocarbamol (ROBAXIN) 500 MG tablet Take 1 tablet (500 mg total) by mouth 2 (two) times daily. 01/30/15   Fayrene HelperBowie Tran, PA-C   Meds Ordered and Administered this Visit  Medications - No data to display  BP 129/85 mmHg  Pulse 112  Temp(Src) 102.4 F (39.1 C) (Oral)  Resp 16  SpO2 98% No data found.   Physical Exam  Constitutional: He is oriented to person, place, and time. He appears well-developed and well-nourished.  HENT:  Right Ear: External ear normal.  Left Ear: External ear normal.  Mouth/Throat: Uvula is midline and mucous membranes are normal. No uvula swelling. Oropharyngeal exudate, posterior oropharyngeal edema and posterior oropharyngeal erythema present.  Neck: Normal range of motion. Neck supple.  Pulmonary/Chest: Effort normal and breath sounds normal.  Lymphadenopathy:    He has cervical adenopathy.  Neurological: He is alert and oriented to person, place, and time.  Skin: Skin is warm and dry.  Nursing note and vitals reviewed.   ED Course  Procedures (including critical care time)  Labs Review Labs Reviewed - No data to display  Imaging Review No results found.   Visual Acuity Review  Right Eye Distance:   Left Eye Distance:   Bilateral Distance:    Right Eye Near:   Left Eye Near:    Bilateral Near:         MDM   1. Strep sore throat        Linna HoffJames D Aimy Sweeting, MD 05/25/15 [phone]

## 2015-05-25 NOTE — ED Notes (Signed)
Pt  Reports  Symptoms  Of  sorethroat   Pain  When  She  Swallows  As   Well       As  Fever with the  Symptoms  For   approx  2-3  Days

## 2015-05-25 NOTE — Discharge Instructions (Signed)
Drink lots of fluids, take all of medicine, use lozenges as needed.return if needed, start with 2 caps tonight

## 2015-05-26 ENCOUNTER — Emergency Department (HOSPITAL_COMMUNITY)
Admission: EM | Admit: 2015-05-26 | Discharge: 2015-05-26 | Disposition: A | Discharge status: Home / Self Care | Payer: 59 | Attending: Emergency Medicine | Admitting: Emergency Medicine

## 2015-05-26 ENCOUNTER — Encounter (HOSPITAL_COMMUNITY): Payer: Self-pay

## 2015-05-26 DIAGNOSIS — R000 Tachycardia, unspecified: Secondary | ICD-10-CM | POA: Insufficient documentation

## 2015-05-26 DIAGNOSIS — Z791 Long term (current) use of non-steroidal anti-inflammatories (NSAID): Secondary | ICD-10-CM | POA: Diagnosis not present

## 2015-05-26 DIAGNOSIS — H9201 Otalgia, right ear: Secondary | ICD-10-CM | POA: Diagnosis not present

## 2015-05-26 DIAGNOSIS — Z792 Long term (current) use of antibiotics: Secondary | ICD-10-CM | POA: Insufficient documentation

## 2015-05-26 DIAGNOSIS — F17210 Nicotine dependence, cigarettes, uncomplicated: Secondary | ICD-10-CM | POA: Insufficient documentation

## 2015-05-26 DIAGNOSIS — R630 Anorexia: Secondary | ICD-10-CM | POA: Diagnosis not present

## 2015-05-26 DIAGNOSIS — Z79899 Other long term (current) drug therapy: Secondary | ICD-10-CM | POA: Diagnosis not present

## 2015-05-26 DIAGNOSIS — R42 Dizziness and giddiness: Secondary | ICD-10-CM | POA: Insufficient documentation

## 2015-05-26 DIAGNOSIS — J309 Allergic rhinitis, unspecified: Secondary | ICD-10-CM | POA: Diagnosis not present

## 2015-05-26 DIAGNOSIS — R221 Localized swelling, mass and lump, neck: Secondary | ICD-10-CM | POA: Diagnosis present

## 2015-05-26 DIAGNOSIS — J029 Acute pharyngitis, unspecified: Secondary | ICD-10-CM | POA: Insufficient documentation

## 2015-05-26 MED ORDER — HYDROCODONE-ACETAMINOPHEN 7.5-325 MG/15ML PO SOLN: 10.0000 mL | Freq: Four times a day (QID) | ORAL | Status: DC | PRN

## 2015-05-26 MED ORDER — ACETAMINOPHEN 160 MG/5ML PO SOLN
ORAL | Status: DC
Start: 2015-05-26 — End: 2015-05-26
  Filled 2015-05-26: qty 20.3

## 2015-05-26 MED ORDER — CETIRIZINE HCL 10 MG PO TABS: 10.0000 mg | ORAL_TABLET | Freq: Every day | ORAL | Status: DC

## 2015-05-26 MED ORDER — LIDOCAINE VISCOUS 2 % MT SOLN: 10.0000 mL | Freq: Four times a day (QID) | OROMUCOSAL | Status: DC | PRN

## 2015-05-26 MED ORDER — DEXAMETHASONE SODIUM PHOSPHATE 10 MG/ML IJ SOLN
6.0000 mg | Freq: Once | INTRAMUSCULAR | Status: DC
  Filled 2015-05-26: qty 1

## 2015-05-26 MED ORDER — KETOROLAC TROMETHAMINE 30 MG/ML IJ SOLN
30.0000 mg | Freq: Once | INTRAMUSCULAR | Status: AC
  Administered 2015-05-26: 30 mg via INTRAVENOUS
  Filled 2015-05-26: qty 1

## 2015-05-26 MED ORDER — LIDOCAINE VISCOUS 2 % MT SOLN
15.0000 mL | Freq: Once | OROMUCOSAL | Status: AC
  Administered 2015-05-26: 15 mL via OROMUCOSAL
  Filled 2015-05-26: qty 15

## 2015-05-26 MED ORDER — FLUTICASONE PROPIONATE 50 MCG/ACT NA SUSP: 2.0000 | Freq: Every day | NASAL | Status: DC

## 2015-05-26 MED ORDER — PENICILLIN G BENZATHINE 1200000 UNIT/2ML IM SUSP
1.2000 10*6.[IU] | Freq: Once | INTRAMUSCULAR | Status: AC
  Administered 2015-05-26: 1.2 10*6.[IU] via INTRAMUSCULAR
  Filled 2015-05-26: qty 2

## 2015-05-26 MED ORDER — ACETAMINOPHEN 160 MG/5ML PO SOLN
650.0000 mg | Freq: Once | ORAL | Status: AC
  Administered 2015-05-26: 650 mg via ORAL

## 2015-05-26 MED ORDER — DEXAMETHASONE SODIUM PHOSPHATE 10 MG/ML IJ SOLN
6.0000 mg | Freq: Once | INTRAMUSCULAR | Status: AC
  Administered 2015-05-26: 6 mg via INTRAVENOUS

## 2015-05-26 MED ORDER — SODIUM CHLORIDE 0.9 % IV BOLUS (SEPSIS)
1000.0000 mL | Freq: Once | INTRAVENOUS | Status: AC
  Administered 2015-05-26: 1000 mL via INTRAVENOUS

## 2015-05-26 NOTE — Discharge Instructions (Signed)
Pharyngitis Pharyngitis is redness, pain, and swelling (inflammation) of your pharynx.  CAUSES  Pharyngitis is usually caused by infection. Most of the time, these infections are from viruses (viral) and are part of a cold. However, sometimes pharyngitis is caused by bacteria (bacterial). Pharyngitis can also be caused by allergies. Viral pharyngitis may be spread from person to person by coughing, sneezing, and personal items or utensils (cups, forks, spoons, toothbrushes). Bacterial pharyngitis may be spread from person to person by more intimate contact, such as kissing.  SIGNS AND SYMPTOMS  Symptoms of pharyngitis include:   Sore throat.   Tiredness (fatigue).   Low-grade fever.   Headache.  Joint pain and muscle aches.  Skin rashes.  Swollen lymph nodes.  Plaque-like film on throat or tonsils (often seen with bacterial pharyngitis). DIAGNOSIS  Your health care provider will ask you questions about your illness and your symptoms. Your medical history, along with a physical exam, is often all that is needed to diagnose pharyngitis. Sometimes, a rapid strep test is done. Other lab tests may also be done, depending on the suspected cause.  TREATMENT  Viral pharyngitis will usually get better in 3-4 days without the use of medicine. Bacterial pharyngitis is treated with medicines that kill germs (antibiotics).  HOME CARE INSTRUCTIONS   Drink enough water and fluids to keep your urine clear or pale yellow.   Only take over-the-counter or prescription medicines as directed by your health care provider:   If you are prescribed antibiotics, make sure you finish them even if you start to feel better.   Do not take aspirin.   Get lots of rest.   Gargle with 8 oz of salt water ( tsp of salt per 1 qt of water) as often as every 1-2 hours to soothe your throat.   Throat lozenges (if you are not at risk for choking) or sprays may be used to soothe your throat. SEEK MEDICAL  CARE IF:   You have large, tender lumps in your neck.  You have a rash.  You cough up green, yellow-brown, or bloody spit. SEEK IMMEDIATE MEDICAL CARE IF:   Your neck becomes stiff.  You drool or are unable to swallow liquids.  You vomit or are unable to keep medicines or liquids down.  You have severe pain that does not go away with the use of recommended medicines.  You have trouble breathing (not caused by a stuffy nose). MAKE SURE YOU:   Understand these instructions.  Will watch your condition.  Will get help right away if you are not doing well or get worse.   This information is not intended to replace advice given to you by your health care provider. Make sure you discuss any questions you have with your health care provider.   Document Released: 04/25/2005 Document Revised: 02/13/2013 Document Reviewed: 12/31/2012 Elsevier Interactive Patient Education 2016 [employer]  Strep Throat Strep throat is a bacterial infection of the throat. Your health care provider may call the infection tonsillitis or pharyngitis, depending on whether there is swelling in the tonsils or at the back of the throat. Strep throat is most common during the cold months of the year in children who are 10-105 years of age, but it can happen during any season in people of any age. This infection is spread from person to person (contagious) through coughing, sneezing, or close contact. CAUSES Strep throat is caused by the bacteria called Streptococcus pyogenes. RISK FACTORS This condition is more likely to  develop in:  People who spend time in crowded places where the infection can spread easily.  People who have close contact with someone who has strep throat. SYMPTOMS Symptoms of this condition include:  Fever or chills.   Redness, swelling, or pain in the tonsils or throat.  Pain or difficulty when swallowing.  White or yellow spots on the tonsils or throat.  Swollen, tender glands  in the neck or under the jaw.  Red rash all over the body (rare). DIAGNOSIS This condition is diagnosed by performing a rapid strep test or by taking a swab of your throat (throat culture test). Results from a rapid strep test are usually ready in a few minutes, but throat culture test results are available after one or two days. TREATMENT This condition is treated with antibiotic medicine. HOME CARE INSTRUCTIONS Medicines  Take over-the-counter and prescription medicines only as told by your health care provider.  Take your antibiotic as told by your health care provider. Do not stop taking the antibiotic even if you start to feel better.  Have family members who also have a sore throat or fever tested for strep throat. They may need antibiotics if they have the strep infection. Eating and Drinking  Do not share food, drinking cups, or personal items that could cause the infection to spread to other people.  If swallowing is difficult, try eating soft foods until your sore throat feels better.  Drink enough fluid to keep your urine clear or pale yellow. General Instructions  Gargle with a salt-water mixture 3-4 times per day or as needed. To make a salt-water mixture, completely dissolve -1 tsp of salt in 1 cup of warm water.  Make sure that all household members wash their hands well.  Get plenty of rest.  Stay home from school or work until you have been taking antibiotics for 24 hours.  Keep all follow-up visits as told by your health care provider. This is important. SEEK MEDICAL CARE IF:  The glands in your neck continue to get bigger.  You develop a rash, cough, or earache.  You cough up a thick liquid that is green, yellow-brown, or bloody.  You have pain or discomfort that does not get better with medicine.  Your problems seem to be getting worse rather than better.  You have a fever. SEEK IMMEDIATE MEDICAL CARE IF:  You have new symptoms, such as vomiting,  severe headache, stiff or painful neck, chest pain, or shortness of breath.  You have severe throat pain, drooling, or changes in your voice.  You have swelling of the neck, or the skin on the neck becomes red and tender.  You have signs of dehydration, such as fatigue, dry mouth, and decreased urination.  You become increasingly sleepy, or you cannot wake up completely.  Your joints become red or painful.   This information is not intended to replace advice given to you by your health care provider. Make sure you discuss any questions you have with your health care provider.   Document Released: 04/22/2000 Document Revised: 01/14/2015 Document Reviewed: 08/18/2014 Elsevier Interactive Patient Education 2016 [employer] Inc[employer] Peritonsillar Abscess A peritonsillar abscess is a collection of yellowish-white fluid (pus) in the back of the throat behind the tonsils. It usually occurs when an infection of the throat or tonsils (tonsillitis) spreads into the tissues around the tonsils. CAUSES The infection that leads to a peritonsillar abscess is usually caused by streptococcal bacteria.  SIGNS AND SYMPTOMS  Sore throat, often  with pain on just one side.  Swelling and tenderness of the glands (lymph nodes) in the neck.  Difficulty swallowing.  Difficulty opening your mouth.  Fever.  Chills.  Drooling because of difficulty swallowing saliva.  Headache.  Changes in your voice.  Bad breath. DIAGNOSIS Your health care provider will take your medical history and do a physical exam. Imaging tests may be done, such as an ultrasound or CT scan. A sample of pus may be removed from the abscess using a needle (needle aspiration) or by swabbing the back of your throat. This sample will be sent to a lab for testing. TREATMENT Treatment usually involves draining the pus from the abscess. This may be done through needle aspiration or by making an incision in the abscess. You will also likely need  to take antibiotic medicine. HOME CARE INSTRUCTIONS  Rest as much as possible and get plenty of sleep.  Take medicines only as directed by your health care provider.  If you were prescribed an antibiotic medicine, finish it all even if you start to feel better.  If your abscess was drained by your health care provider, gargle with a mixture of salt and warm water:  Mix 1 tsp of salt in 8 oz of warm water.  Gargle with this mixture four times per day or as needed for comfort.  Do not swallow this mixture.  Drink plenty of fluids.  While your throat is sore, eat soft or liquid foods, such as frozen ice pops and ice cream.  Keep all follow-up visits as directed by your health care provider. This is important. SEEK MEDICAL CARE IF:  You have increased pain, swelling, redness, or drainage in your throat.  You develop a headache, a lack of energy (lethargy), or generalized feelings of illness.  You have a fever.  You feel dizzy.  You have difficulty swallowing or eating.  You show signs of becoming dehydrated, such as:  Light-headedness when standing.  Decreased urine output.  A fast heart rate.  Dry mouth. SEEK IMMEDIATE MEDICAL CARE IF:   You have difficulty talking or breathing, or you find it easier to breathe when you lean forward.  You are coughing up blood or vomiting blood.  You have severe throat pain that is not helped by medicines.  You start to drool.   This information is not intended to replace advice given to you by your health care provider. Make sure you discuss any questions you have with your health care provider.   Document Released: 04/25/2005 Document Revised: 05/16/2014 Document Reviewed: 12/09/2013 Elsevier Interactive Patient Education 2016 [employer].  Allergic Rhinitis Allergic rhinitis is when the mucous membranes in the nose respond to allergens. Allergens are particles in the air that cause your body to have an allergic reaction.  This causes you to release allergic antibodies. Through a chain of events, these eventually cause you to release histamine into the blood stream. Although meant to protect the body, it is this release of histamine that causes your discomfort, such as frequent sneezing, congestion, and an itchy, runny nose.  CAUSES Seasonal allergic rhinitis (hay fever) is caused by pollen allergens that may come from grasses, trees, and weeds. Year-round allergic rhinitis (perennial allergic rhinitis) is caused by allergens such as house dust mites, pet dander, and mold spores. SYMPTOMS  Nasal stuffiness (congestion).  Itchy, runny nose with sneezing and tearing of the eyes. DIAGNOSIS Your health care provider can help you determine the allergen or allergens that trigger your  symptoms. If you and your health care provider are unable to determine the allergen, skin or blood testing may be used. Your health care provider will diagnose your condition after taking your health history and performing a physical exam. Your health care provider may assess you for other related conditions, such as asthma, pink eye, or an ear infection. TREATMENT Allergic rhinitis does not have a cure, but it can be controlled by:  Medicines that block allergy symptoms. These may include allergy shots, nasal sprays, and oral antihistamines.  Avoiding the allergen. Hay fever may often be treated with antihistamines in pill or nasal spray forms. Antihistamines block the effects of histamine. There are over-the-counter medicines that may help with nasal congestion and swelling around the eyes. Check with your health care provider before taking or giving this medicine. If avoiding the allergen or the medicine prescribed do not work, there are many new medicines your health care provider can prescribe. Stronger medicine may be used if initial measures are ineffective. Desensitizing injections can be used if medicine and avoidance does not work.  Desensitization is when a patient is given ongoing shots until the body becomes less sensitive to the allergen. Make sure you follow up with your health care provider if problems continue. HOME CARE INSTRUCTIONS It is not possible to completely avoid allergens, but you can reduce your symptoms by taking steps to limit your exposure to them. It helps to know exactly what you are allergic to so that you can avoid your specific triggers. SEEK MEDICAL CARE IF:  You have a fever.  You develop a cough that does not stop easily (persistent).  You have shortness of breath.  You start wheezing.  Symptoms interfere with normal daily activities.   This information is not intended to replace advice given to you by your health care provider. Make sure you discuss any questions you have with your health care provider.   Document Released: 01/18/2001 Document Revised: 05/16/2014 Document Reviewed: 12/31/2012 Elsevier Interactive Patient Education [employer]

## 2015-05-26 NOTE — ED Provider Notes (Signed)
CSN: [id number]     Arrival date & time 05/26/15  1216 History   First MD Initiated Contact with Patient 05/26/15 1628     Chief Complaint  Patient presents with  . throat swelling      (Consider location/radiation/quality/duration/timing/severity/associated sxs/prior Treatment) HPI   Jesse Reed is a 25 y.o. male with hx of allergic rhinitis, presents to there ER with 3d of sore throat which was evaluated yesterday at urgent care, dx with strep throat, and initiated tx with amoxicillin.  The pt states that he was able to take a dose last night and a dose this morning, however he states "it is not working because my throat is almost closed." He states he's continued to have fevers, states he has not eaten or drinking anything in the past 24 hours.  He can open and close his mouth and speak normally, however he states it is more painful with any movement. He has associated right ear pain which is new.  He feels generalized malaise and lightheadedness with standing. He states that his urine has darkened. He denies chest pain, shortness of breath, headache.  In the ER he did eat Tylenol mixed in applesauce that he is spitting out his secretions.  Only other complaint is nasal discharge that he feels good in the back of his throat. 3 days ago he traveled to [place] and is allergic rhinitis was irritated with the climate change. He states that apparently once a year he'll have a severe sore throat. He denies seeing ENT in the past.   History reviewed. No pertinent past medical history. History reviewed. No pertinent past surgical history. No family history on file. Social History  Substance Use Topics  . Smoking status: Current Some Day Smoker    Types: Cigars  . Smokeless tobacco: None  . Alcohol Use: No    Review of Systems  Constitutional: Positive for fever. Negative for chills, activity change, appetite change and fatigue.  HENT: Positive for ear pain, postnasal drip,  rhinorrhea, sore throat and trouble swallowing. Negative for congestion, dental problem, drooling, ear discharge, facial swelling, hearing loss, nosebleeds, sinus pressure, tinnitus and voice change.   Respiratory: Negative.  Negative for apnea, choking, shortness of breath and stridor.   Cardiovascular: Negative.  Negative for chest pain.  Gastrointestinal: Negative.  Negative for nausea, vomiting, abdominal pain and diarrhea.  Genitourinary: Negative.   Musculoskeletal: Positive for neck pain. Negative for neck stiffness.  Skin: Negative.   Neurological: Positive for light-headedness. Negative for dizziness, syncope, facial asymmetry, weakness and headaches.  Hematological: Negative.   Psychiatric/Behavioral: Negative.   All other systems reviewed and are negative.     Allergies  Review of patient's allergies indicates no known allergies.  Home Medications   Prior to Admission medications   Medication Sig Start Date End Date Taking? Authorizing Provider  amoxicillin (AMOXIL) 500 MG capsule Take 1 capsule (500 mg total) by mouth 3 (three) times daily. 05/25/15  Yes Linna Hoff, MD  Ascorbic Acid (VITAMIN C PO) Take 1 tablet by mouth daily.   Yes Historical Provider, MD  ibuprofen (ADVIL,MOTRIN) 800 MG tablet Take 1 tablet (800 mg total) by mouth 3 (three) times daily. 01/30/15  Yes Fayrene Helper, PA-C  cetirizine (ZYRTEC ALLERGY) 10 MG tablet Take 1 tablet (10 mg total) by mouth daily. 05/26/15   Danelle Berry, PA-C  fluticasone (FLONASE) 50 MCG/ACT nasal spray Place 2 sprays into both nostrils daily. 05/26/15   Danelle Berry, PA-C  HYDROcodone-acetaminophen (HYCET) 7.5-325 mg/15  ml solution Take 10 mLs by mouth 4 (four) times daily as needed for moderate pain. 05/26/15   Danelle Berry, PA-C  lidocaine (XYLOCAINE) 2 % solution Use as directed 10 mLs in the mouth or throat every 6 (six) hours as needed for mouth pain. 05/26/15   Danelle Berry, PA-C   BP 141/79 mmHg  Pulse 94  Temp(Src) 98.3 F (36.8  C) (Oral)  Resp 15  SpO2 98% Physical Exam  Constitutional: He is oriented to person, place, and time. He appears well-developed and well-nourished. No distress.  HENT:  Head: Normocephalic and atraumatic.  Right Ear: Tympanic membrane, external ear and ear canal normal. No drainage or swelling. Tympanic membrane is not injected, not erythematous, not retracted and not bulging. No middle ear effusion.  Left Ear: Hearing, tympanic membrane, external ear and ear canal normal. No drainage or swelling. Tympanic membrane is not injected, not erythematous, not retracted and not bulging.  No middle ear effusion.  Ears:  Nose: Mucosal edema and rhinorrhea present. No sinus tenderness, nasal deformity or septal deviation.  Mouth/Throat: Uvula is midline and mucous membranes are normal. Mucous membranes are not pale, not dry and not cyanotic. No trismus in the jaw. Posterior oropharyngeal edema and posterior oropharyngeal erythema present. No oropharyngeal exudate or tonsillar abscesses.  Eyes: Conjunctivae, EOM and lids are normal. Pupils are equal, round, and reactive to light. Right eye exhibits no discharge. Left eye exhibits no discharge. No scleral icterus.  Neck: Normal range of motion. Neck supple. No JVD present. Tracheal tenderness present. No rigidity. No tracheal deviation and normal range of motion present. No thyromegaly present.  Cardiovascular: Regular rhythm, normal heart sounds, intact distal pulses and normal pulses.  Tachycardia present.  Exam reveals no gallop and no friction rub.   No murmur heard. Pulmonary/Chest: Effort normal and breath sounds normal. No respiratory distress. He has no wheezes. He has no rales. He exhibits no tenderness.  Abdominal: Soft. Bowel sounds are normal. He exhibits no distension and no mass. There is no tenderness. There is no rebound and no guarding.  Musculoskeletal: Normal range of motion. He exhibits no edema or tenderness.  Lymphadenopathy:    He  has cervical adenopathy.  Neurological: He is alert and oriented to person, place, and time. He has normal reflexes. No cranial nerve deficit. He exhibits normal muscle tone. Coordination normal.  Skin: Skin is warm and dry. No rash noted. He is not diaphoretic. No erythema. No pallor.  Psychiatric: He has a normal mood and affect. His behavior is normal. Judgment and thought content normal.  Nursing note and vitals reviewed.   ED Course  Procedures (including critical care time) Labs Review Labs Reviewed - No data to display  Imaging Review No results found. I have personally reviewed and evaluated these images and lab results as part of my medical decision-making.   EKG Interpretation None      MDM   Patient with 3 days of sore throat, treatment for strep pharyngitis initiated yesterday with amoxicillin at urgent care. Patient presents to the emergency department stating he has had increased swelling and pain in his throat with difficulty swallowing. He has been able to swallow in the ER today. He is clearing his secretions, although he chooses to spit out most of them due to discomfort.  He is mildly tachycardic and febrile.  Clinically does not appear severely dehydrated, appears nontoxic.  Low concern for PTA.  Tonsils symmetrical and uvula midline.  Pt is able to speak.  Airway intact, no SOB.  Will give Pt IVF, has already received antipyretics, will give decadron, toradol, and IM Bicillin. Medications  acetaminophen (TYLENOL) solution 650 mg (650 mg Oral Given 05/26/15 1226)  sodium chloride 0.9 % bolus 1,000 mL (0 mLs Intravenous Stopped 05/26/15 1843)  ketorolac (TORADOL) 30 MG/ML injection 30 mg (30 mg Intravenous Given 05/26/15 1712)  penicillin g benzathine (BICILLIN LA) 1200000 UNIT/2ML injection 1.2 Million Units (1.2 Million Units Intramuscular Given 05/26/15 1712)  dexamethasone (DECADRON) injection 6 mg (6 mg Intravenous Given 05/26/15 1712)  lidocaine (XYLOCAINE) 2 %  viscous mouth solution 15 mL (15 mLs Mouth/Throat Given 05/26/15 1906)   Will monitor HR on monitor, anticipate improvement and D/C with ENT referral. Filed Vitals:   05/26/15 1636 05/26/15 1638 05/26/15 1843 05/26/15 1845  BP: 154/102  133/78 141/79  Pulse:  105 93 94  Temp:   98.3 F (36.8 C)   TempSrc:      Resp:   15   SpO2:  100% 97% 98%     9:38 AM Pt's fever and pulse have improved.  He reports improved pain and ability to speak.  He was given viscous lido and had successful fluid challenge.  Return precautions reviewed with pt and with pt's mother, who verbalize understanding. He was discharged home in stable condition, wit viscous lido, hycet, zyrtec and flonase. Encouraged to F/up with PCP, mother states he does have PCP.  Referral for ENT given.  Final diagnoses:  Acute pharyngitis, unspecified etiology  Allergic rhinitis, unspecified allergic rhinitis type      Danelle Berry, PA-C 05/30/15 0940  Pricilla Loveless, MD 05/31/15 [phone]

## 2015-05-26 NOTE — ED Notes (Signed)
Patient diagnosed with strept yesterday and prescribed antibiotic, reports increased fever and unable to handle secretions

## 2015-07-15 ENCOUNTER — Other Ambulatory Visit (HOSPITAL_COMMUNITY)
Admission: RE | Admit: 2015-07-15 | Discharge: 2015-07-15 | Disposition: A | Discharge status: Home / Self Care | Payer: 59 | Source: Ambulatory Visit | Attending: Family Medicine | Admitting: Family Medicine

## 2015-07-15 ENCOUNTER — Encounter (HOSPITAL_COMMUNITY): Payer: Self-pay | Admitting: Emergency Medicine

## 2015-07-15 ENCOUNTER — Emergency Department (INDEPENDENT_AMBULATORY_CARE_PROVIDER_SITE_OTHER)
Admission: EM | Admit: 2015-07-15 | Discharge: 2015-07-15 | Disposition: A | Discharge status: Home / Self Care | Payer: 59 | Source: Home / Self Care | Attending: Family Medicine | Admitting: Family Medicine

## 2015-07-15 DIAGNOSIS — J029 Acute pharyngitis, unspecified: Secondary | ICD-10-CM

## 2015-07-15 LAB — POCT RAPID STREP A: Streptococcus, Group A Screen (Direct): NEGATIVE

## 2015-07-15 LAB — POCT INFECTIOUS MONO SCREEN: Mono Screen: NEGATIVE

## 2015-07-15 NOTE — ED Provider Notes (Signed)
CSN: [id number]     Arrival date & time 07/15/15  1307 History   First MD Initiated Contact with Patient 07/15/15 1339     Chief Complaint  Patient presents with  . Sore Throat   (Consider location/radiation/quality/duration/timing/severity/associated sxs/prior Treatment) HPI Comments: 25 year old male complaining of sore throat for 2 days. He states it is associated with fever and his temperature this morning at 3 AM was 102. Currently he is afebrile with normal vital signs. Occasionally has a cough and continuous PND. Hurts to swallow Nothing makes it better   History reviewed. No pertinent past medical history. History reviewed. No pertinent past surgical history. History reviewed. No pertinent family history. Social History  Substance Use Topics  . Smoking status: Current Some Day Smoker    Types: Cigars  . Smokeless tobacco: None  . Alcohol Use: No    Review of Systems  Constitutional: Positive for fever and activity change. Negative for diaphoresis and fatigue.  HENT: Positive for postnasal drip and sore throat. Negative for congestion, ear pain, facial swelling, rhinorrhea and trouble swallowing.   Eyes: Negative for pain, discharge and redness.  Respiratory: Positive for cough. Negative for chest tightness and shortness of breath.   Cardiovascular: Negative.   Gastrointestinal: Negative.   Musculoskeletal: Negative.  Negative for neck pain and neck stiffness.  Skin: Negative for rash.  Neurological: Negative.   All other systems reviewed and are negative.   Allergies  Review of patient's allergies indicates no known allergies.  Home Medications   Prior to Admission medications   Medication Sig Start Date End Date Taking? Authorizing Provider  amoxicillin (AMOXIL) 500 MG capsule Take 1 capsule (500 mg total) by mouth 3 (three) times daily. Patient not taking: Reported on 07/15/2015 05/25/15   Linna Hoff, MD  Ascorbic Acid (VITAMIN C PO) Take 1 tablet by mouth  daily.    Historical Provider, MD  cetirizine (ZYRTEC ALLERGY) 10 MG tablet Take 1 tablet (10 mg total) by mouth daily. 05/26/15   Danelle Berry, PA-C  fluticasone (FLONASE) 50 MCG/ACT nasal spray Place 2 sprays into both nostrils daily. 05/26/15   Danelle Berry, PA-C  HYDROcodone-acetaminophen (HYCET) 7.5-325 mg/15 ml solution Take 10 mLs by mouth 4 (four) times daily as needed for moderate pain. 05/26/15   Danelle Berry, PA-C  ibuprofen (ADVIL,MOTRIN) 800 MG tablet Take 1 tablet (800 mg total) by mouth 3 (three) times daily. 01/30/15   Fayrene Helper, PA-C  lidocaine (XYLOCAINE) 2 % solution Use as directed 10 mLs in the mouth or throat every 6 (six) hours as needed for mouth pain. 05/26/15   Danelle Berry, PA-C   Meds Ordered and Administered this Visit  Medications - No data to display  BP 133/84 mmHg  Pulse 95  Temp(Src) 98 F (36.7 C) (Oral)  Resp 16  SpO2 98% No data found.   Physical Exam  Constitutional: He is oriented to person, place, and time. He appears well-developed and well-nourished. No distress.  HENT:  Mouth/Throat: No oropharyngeal exudate.  Bilateral TMs are retracted. Oropharynx with erythema. No exudates. Positive for clear PND.  Eyes: Conjunctivae and EOM are normal.  Neck: Normal range of motion. Neck supple.  Cardiovascular: Normal rate, regular rhythm and normal heart sounds.   Pulmonary/Chest: Effort normal and breath sounds normal. No respiratory distress. He has no wheezes. He has no rales.  Musculoskeletal: Normal range of motion. He exhibits no edema.  Lymphadenopathy:    He has no cervical adenopathy.  Neurological: He is alert and  oriented to person, place, and time.  Skin: Skin is warm and dry. No rash noted.  Psychiatric: He has a normal mood and affect.  Nursing note and vitals reviewed.   ED Course  Procedures (including critical care time)  Labs Review Labs Reviewed  POCT RAPID STREP A  POCT INFECTIOUS MONO SCREEN   Results for orders placed or  performed during the hospital encounter of 07/15/15  POCT rapid strep A [facility])  Result Value Ref Range   Streptococcus, Group A Screen (Direct) NEGATIVE NEGATIVE  Infectious mono screen, POC  Result Value Ref Range   Mono Screen NEGATIVE NEGATIVE     Imaging Review No results found.   Visual Acuity Review  Right Eye Distance:   Left Eye Distance:   Bilateral Distance:    Right Eye Near:   Left Eye Near:    Bilateral Near:         MDM   1. Pharyngitis   2. Sore throat    Strep and mono neg. Throat without exudates and no lymphadenopathy. Ibuprofen 600 mg every 6 hours as needed for pain or fever Cepacol lozenges Lots of fluids A follow up test for strep has been started. If it turns out to be positive we will call you and treat over the phone.     Hayden Rasmussenavid Tynesia Harral, NP 07/15/15 1501

## 2015-07-15 NOTE — ED Notes (Signed)
Sore throat onset 3/6.  Intermittent cough

## 2015-07-15 NOTE — Discharge Instructions (Signed)
Pharyngitis Pharyngitis is a sore throat (pharynx). There is redness, pain, and swelling of your throat. HOME CARE   Drink enough fluids to keep your pee (urine) clear or pale yellow.  Only take medicine as told by your doctor.  You may get sick again if you do not take medicine as told. Finish your medicines, even if you start to feel better.  Do not take aspirin.  Rest.  Rinse your mouth (gargle) with salt water ( tsp of salt per 1 qt of water) every 1-2 hours. This will help the pain.  If you are not at risk for choking, you can suck on hard candy or sore throat lozenges. GET HELP IF:  You have large, tender lumps on your neck.  You have a rash.  You cough up green, yellow-brown, or bloody spit. GET HELP RIGHT AWAY IF:   You have a stiff neck.  You drool or cannot swallow liquids.  You throw up (vomit) or are not able to keep medicine or liquids down.  You have very bad pain that does not go away with medicine.  You have problems breathing (not from a stuffy nose). MAKE SURE YOU:   Understand these instructions.  Will watch your condition.  Will get help right away if you are not doing well or get worse.   This information is not intended to replace advice given to you by your health care provider. Make sure you discuss any questions you have with your health care provider.   Document Released: 10/12/2007 Document Revised: 02/13/2013 Document Reviewed: 12/31/2012 Elsevier Interactive Patient Education 2016 Elsevier Inc[employer]  Sore Throat Ibuprofen 600 mg every 6 hours as needed for pain or fever Cepacol lozenges Lots of fluids A follow up test for strep has been started. If it turns out to be positive we will call you and treat over the phone. A sore throat is pain, burning, irritation, or scratchiness of the throat. There is often pain or tenderness when swallowing or talking. A sore throat may be accompanied by other symptoms, such as coughing, sneezing, fever,  and swollen neck glands. A sore throat is often the first sign of another sickness, such as a cold, flu, strep throat, or mononucleosis (commonly known as mono). Most sore throats go away without medical treatment. CAUSES  The most common causes of a sore throat include:  A viral infection, such as a cold, flu, or mono.  A bacterial infection, such as strep throat, tonsillitis, or whooping cough.  Seasonal allergies.  Dryness in the air.  Irritants, such as smoke or pollution.  Gastroesophageal reflux disease (GERD). HOME CARE INSTRUCTIONS   Only take over-the-counter medicines as directed by your caregiver.  Drink enough fluids to keep your urine clear or pale yellow.  Rest as needed.  Try using throat sprays, lozenges, or sucking on hard candy to ease any pain (if older than 4 years or as directed).  Sip warm liquids, such as broth, herbal tea, or warm water with honey to relieve pain temporarily. You may also eat or drink cold or frozen liquids such as frozen ice pops.  Gargle with salt water (mix 1 tsp salt with 8 oz of water).  Do not smoke and avoid secondhand smoke.  Put a cool-mist humidifier in your bedroom at night to moisten the air. You can also turn on a hot shower and sit in the bathroom with the door closed for 5-10 minutes. SEEK IMMEDIATE MEDICAL CARE IF:  You have difficulty breathing.  You are unable to swallow fluids, soft foods, or your saliva.  You have increased swelling in the throat.  Your sore throat does not get better in 7 days.  You have nausea and vomiting.  You have a fever or persistent symptoms for more than 2-3 days.  You have a fever and your symptoms suddenly get worse. MAKE SURE YOU:   Understand these instructions.  Will watch your condition.  Will get help right away if you are not doing well or get worse.   This information is not intended to replace advice given to you by your health care provider. Make sure you discuss  any questions you have with your health care provider.   Document Released: 06/02/2004 Document Revised: 05/16/2014 Document Reviewed: 01/01/2012 Elsevier Interactive Patient Education [employer] Elsevier Inc.

## 2015-07-17 ENCOUNTER — Emergency Department (HOSPITAL_COMMUNITY)
Admission: EM | Admit: 2015-07-17 | Discharge: 2015-07-17 | Disposition: A | Discharge status: Home / Self Care | Payer: 59 | Attending: Emergency Medicine | Admitting: Emergency Medicine

## 2015-07-17 ENCOUNTER — Encounter (HOSPITAL_COMMUNITY): Payer: Self-pay | Admitting: Emergency Medicine

## 2015-07-17 DIAGNOSIS — Z79899 Other long term (current) drug therapy: Secondary | ICD-10-CM | POA: Diagnosis not present

## 2015-07-17 DIAGNOSIS — J029 Acute pharyngitis, unspecified: Secondary | ICD-10-CM | POA: Diagnosis not present

## 2015-07-17 DIAGNOSIS — F17210 Nicotine dependence, cigarettes, uncomplicated: Secondary | ICD-10-CM | POA: Insufficient documentation

## 2015-07-17 DIAGNOSIS — R6889 Other general symptoms and signs: Secondary | ICD-10-CM

## 2015-07-17 DIAGNOSIS — Z791 Long term (current) use of non-steroidal anti-inflammatories (NSAID): Secondary | ICD-10-CM | POA: Diagnosis not present

## 2015-07-17 DIAGNOSIS — Z7951 Long term (current) use of inhaled steroids: Secondary | ICD-10-CM | POA: Insufficient documentation

## 2015-07-17 DIAGNOSIS — R509 Fever, unspecified: Secondary | ICD-10-CM | POA: Diagnosis not present

## 2015-07-17 LAB — CULTURE, GROUP A STREP (THRC)

## 2015-07-17 MED ORDER — GUAIFENESIN 100 MG/5ML PO LIQD: 100.0000 mg | ORAL | Status: DC | PRN

## 2015-07-17 MED ORDER — PROMETHAZINE-DM 6.25-15 MG/5ML PO SYRP: 5.0000 mL | ORAL_SOLUTION | Freq: Four times a day (QID) | ORAL | Status: DC | PRN

## 2015-07-17 MED ORDER — CHLORHEXIDINE GLUCONATE 0.12 % MT SOLN: 15.0000 mL | Freq: Two times a day (BID) | OROMUCOSAL | Status: DC

## 2015-07-17 NOTE — ED Notes (Signed)
Pt c/o sore throat x 1 week. No fevers.

## 2015-07-17 NOTE — Discharge Instructions (Signed)
Viral Infections °A viral infection can be caused by different types of viruses. Most viral infections are not serious and resolve on their own. However, some infections may cause severe symptoms and may lead to further complications. °SYMPTOMS °Viruses can frequently cause: °· Minor sore throat. °· Aches and pains. °· Headaches. °· Runny nose. °· Different types of rashes. °· Watery eyes. °· Tiredness. °· Cough. °· Loss of appetite. °· Gastrointestinal infections, resulting in nausea, vomiting, and diarrhea. °These symptoms do not respond to antibiotics because the infection is not caused by bacteria. However, you might catch a bacterial infection following the viral infection. This is sometimes called a "superinfection." Symptoms of such a bacterial infection may include: °· Worsening sore throat with pus and difficulty swallowing. °· Swollen neck glands. °· Chills and a high or persistent fever. °· Severe headache. °· Tenderness over the sinuses. °· Persistent overall ill feeling (malaise), muscle aches, and tiredness (fatigue). °· Persistent cough. °· Yellow, green, or brown mucus production with coughing. °HOME CARE INSTRUCTIONS  °· Only take over-the-counter or prescription medicines for pain, discomfort, diarrhea, or fever as directed by your caregiver. °· Drink enough water and fluids to keep your urine clear or pale yellow. Sports drinks can provide valuable electrolytes, sugars, and hydration. °· Get plenty of rest and maintain proper nutrition. Soups and broths with crackers or rice are fine. °SEEK IMMEDIATE MEDICAL CARE IF:  °· You have severe headaches, shortness of breath, chest pain, neck pain, or an unusual rash. °· You have uncontrolled vomiting, diarrhea, or you are unable to keep down fluids. °· You or your child has an oral temperature above 102° F (38.9° C), not controlled by medicine. °· Your baby is older than 3 months with a rectal temperature of 102° F (38.9° C) or higher. °· Your baby is 3  months old or younger with a rectal temperature of 100.4° F (38° C) or higher. °MAKE SURE YOU:  °· Understand these instructions. °· Will watch your condition. °· Will get help right away if you are not doing well or get worse. °  °This information is not intended to replace advice given to you by your health care provider. Make sure you discuss any questions you have with your health care provider. °  °Document Released: 02/02/2005 Document Revised: 07/18/2011 Document Reviewed: 10/01/2014 °Elsevier Interactive Patient Education ©2016 Elsevier Inc. ° °

## 2015-07-17 NOTE — ED Provider Notes (Signed)
CSN: [id number]     Arrival date & time 07/17/15  0016 History   First MD Initiated Contact with Patient 07/17/15 0032     Chief Complaint  Patient presents with  . Sore Throat     (Consider location/radiation/quality/duration/timing/severity/associated sxs/prior Treatment) HPI   25 year old male presents c/o sore throat.  He report having sore throat for nearly a week along with fever, occasional cough and sinus congestion with sinus drainage.  Sore throat is what bothers him the most.  No specific treatment tried.  No severe headache, sob, abdominal pain, n/v/d or rash.  Pt did f/u with Urgent Care 2 days ago for this.  Strep test and mono test was negative.  His sxs still persists.  Pt is a non smoker.  No recent travel or sick contact.    History reviewed. No pertinent past medical history. History reviewed. No pertinent past surgical history. No family history on file. Social History  Substance Use Topics  . Smoking status: Current Some Day Smoker    Types: Cigars  . Smokeless tobacco: None  . Alcohol Use: No    Review of Systems  All other systems reviewed and are negative.     Allergies  Review of patient's allergies indicates no known allergies.  Home Medications   Prior to Admission medications   Medication Sig Start Date End Date Taking? Authorizing Provider  amoxicillin (AMOXIL) 500 MG capsule Take 1 capsule (500 mg total) by mouth 3 (three) times daily. Patient not taking: Reported on 07/15/2015 05/25/15   Linna HoffJames D Kindl, MD  Ascorbic Acid (VITAMIN C PO) Take 1 tablet by mouth daily.    Historical Provider, MD  cetirizine (ZYRTEC ALLERGY) 10 MG tablet Take 1 tablet (10 mg total) by mouth daily. 05/26/15   Danelle BerryLeisa Tapia, PA-C  fluticasone (FLONASE) 50 MCG/ACT nasal spray Place 2 sprays into both nostrils daily. 05/26/15   Danelle BerryLeisa Tapia, PA-C  HYDROcodone-acetaminophen (HYCET) 7.5-325 mg/15 ml solution Take 10 mLs by mouth 4 (four) times daily as needed for moderate pain.  05/26/15   Danelle BerryLeisa Tapia, PA-C  ibuprofen (ADVIL,MOTRIN) 800 MG tablet Take 1 tablet (800 mg total) by mouth 3 (three) times daily. 01/30/15   Fayrene HelperBowie Alanii Ramer, PA-C  lidocaine (XYLOCAINE) 2 % solution Use as directed 10 mLs in the mouth or throat every 6 (six) hours as needed for mouth pain. 05/26/15   Danelle BerryLeisa Tapia, PA-C   BP 138/87 mmHg  Pulse 70  Temp(Src) 97.8 F (36.6 C) (Oral)  Resp 18  Ht 5\' 11"  (1.803 m)  Wt 117.482 kg  BMI 36.14 kg/m2  SpO2 97% Physical Exam  Constitutional: He is oriented to person, place, and time. He appears well-developed and well-nourished. No distress.  HENT:  Head: Atraumatic.  Right Ear: External ear normal.  Left Ear: External ear normal.  Nose: Nose normal.  Mouth/Throat: Oropharynx is clear and moist. No oropharyngeal exudate.  Oral cavity: uvula is midline, tonsillar enlargement with postoral pharyngeal erythema.  No discharge, no trismus, no evidence of deep tissue infection.   Eyes: Conjunctivae are normal.  Neck: Neck supple.  Cardiovascular: Normal rate and regular rhythm.   Pulmonary/Chest: Effort normal and breath sounds normal. He has no wheezes. He has no rales.  Abdominal: Soft. There is no tenderness.  Lymphadenopathy:    He has cervical adenopathy.  Neurological: He is alert and oriented to person, place, and time.  Skin: No rash noted.  Psychiatric: He has a normal mood and affect.  Nursing note  and vitals reviewed.   ED Course  Procedures (including critical care time)   MDM   Final diagnoses:  Flu-like symptoms    BP 138/87 mmHg  Pulse 70  Temp(Src) 97.8 F (36.6 C) (Oral)  Resp 18  Ht  (1.803 m)  Wt 117.482 kg  BMI 36.14 kg/m2  SpO2 97%   1:05 AM Pt with flu like sxs and sore throat.  Strep test and mono test neg.  Pt is well appearing.  Reassurance given.  sxs treatment provided.  Return precaution discussed.   Fayrene Helper, PA-C 07/17/15 7829  Gilda Crease, MD 07/23/15 2312

## 2015-07-17 NOTE — ED Notes (Signed)
Patient able to ambulate independently  

## 2016-01-06 ENCOUNTER — Emergency Department (HOSPITAL_COMMUNITY)
Admission: EM | Admit: 2016-01-06 | Discharge: 2016-01-06 | Discharge status: Against Medical Advice | Payer: No Typology Code available for payment source

## 2016-01-06 NOTE — ED Notes (Signed)
Pt no answer when called for triage x3.

## 2018-05-26 ENCOUNTER — Emergency Department (HOSPITAL_COMMUNITY): Payer: Self-pay

## 2018-05-26 ENCOUNTER — Other Ambulatory Visit: Payer: Self-pay

## 2018-05-26 ENCOUNTER — Emergency Department (HOSPITAL_COMMUNITY)
Admission: EM | Admit: 2018-05-26 | Discharge: 2018-05-26 | Disposition: A | Discharge status: Home / Self Care | Payer: Self-pay | Attending: Emergency Medicine | Admitting: Emergency Medicine

## 2018-05-26 DIAGNOSIS — F17210 Nicotine dependence, cigarettes, uncomplicated: Secondary | ICD-10-CM | POA: Insufficient documentation

## 2018-05-26 DIAGNOSIS — Z79899 Other long term (current) drug therapy: Secondary | ICD-10-CM | POA: Insufficient documentation

## 2018-05-26 DIAGNOSIS — M7701 Medial epicondylitis, right elbow: Secondary | ICD-10-CM | POA: Insufficient documentation

## 2018-05-26 DIAGNOSIS — L84 Corns and callosities: Secondary | ICD-10-CM | POA: Insufficient documentation

## 2018-05-26 NOTE — ED Triage Notes (Signed)
Pt sts he thinks he has a shard of glass in his L food and his elbow hurts when he turns over in bed.

## 2018-05-26 NOTE — ED Provider Notes (Signed)
[name] EMERGENCY DEPARTMENT Provider Note   CSN: [id number] Arrival date & time: 05/26/18  1705     History   Chief Complaint Chief Complaint  Patient presents with  . Foot Pain    HPI Jesse Reed is a 28 y.o. male who presents to ED for multiple complaints. His first complaint is left lateral foot pain.  States that about 6 months ago he was cleaning up his kitchen when he stepped on a small splinterlike shard of glass.  He states that he tried to remove the glass when it first happened.  Over the past 6 months, a callus has formed in the area and he feels like "it is pushing up further into my foot."  States that the pain is worse with palpation and bearing weight on the area.  He has not been take any medicine help with pain.  Denies any signs of infection or fever. He also complains of 1 month history of right elbow pain.  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  He has not been taking medicines for this as well.  Denies any direct injuries, prior fracture, dislocation procedures in the area, history of gout, history of septic joint, redness, swelling.  HPI  No past medical history on file.  There are no active problems to display for this patient.   No past surgical history on file.      Home Medications    Prior to Admission medications   Medication Sig Start Date End Date Taking? Authorizing Provider  amoxicillin (AMOXIL) 500 MG capsule Take 1 capsule (500 mg total) by mouth 3 (three) times daily. Patient not taking: Reported on 07/15/2015 05/25/15   Linna Hoff, MD  Ascorbic Acid (VITAMIN C PO) Take 1 tablet by mouth daily.    [provider]  cetirizine (ZYRTEC ALLERGY) 10 MG tablet Take 1 tablet (10 mg total) by mouth daily. 05/26/15   Danelle Berry, PA-C  chlorhexidine (PERIDEX) 0.12 % solution Use as directed 15 mLs in the mouth or throat 2 (two) times daily.  07/17/15   Fayrene Helper, PA-C  fluticasone (FLONASE) 50 MCG/ACT nasal spray Place 2 sprays into both nostrils daily. 05/26/15   Danelle Berry, PA-C  guaiFENesin (ROBITUSSIN) 100 MG/5ML liquid Take 5-10 mLs (100-200 mg total) by mouth every 4 (four) hours as needed for congestion. 07/17/15   Fayrene Helper, PA-C  HYDROcodone-acetaminophen (HYCET) 7.5-325 mg/15 ml solution Take 10 mLs by mouth 4 (four) times daily as needed for moderate pain. 05/26/15   Danelle Berry, PA-C  ibuprofen (ADVIL,MOTRIN) 800 MG tablet Take 1 tablet (800 mg total) by mouth 3 (three) times daily. 01/30/15   Fayrene Helper, PA-C  lidocaine (XYLOCAINE) 2 % solution Use as directed 10 mLs in the mouth or throat every 6 (six) hours as needed for mouth pain. 05/26/15   Danelle Berry, PA-C  promethazine-dextromethorphan (PROMETHAZINE-DM) 6.25-15 MG/5ML syrup Take 5 mLs by mouth 4 (four) times daily as needed for cough. 07/17/15   Fayrene Helper, PA-C    Family History No family history on file.  Social History Social History   Tobacco Use  . Smoking status: Current Some Day Smoker    Types: Cigars  Substance Use Topics  . Alcohol use: No  . Drug use: No     Allergies   Patient has no known allergies.   Review of Systems Review of Systems  Constitutional: Negative for  chills and fever.  Musculoskeletal: Positive for arthralgias. Negative for myalgias.  Skin: Positive for wound.  Neurological: Negative for weakness.     Physical Exam Updated Vital Signs BP (!) 138/91 (BP Location: Left Arm)   Pulse 98   Temp 97.7 F (36.5 C) (Oral)   Resp 16   Ht 5\' 11"  (1.803 m)   Wt 117.9 kg   SpO2 99%   BMI 36.26 kg/m   Physical Exam Vitals signs and nursing note reviewed.  Constitutional:      General: He is not in acute distress.    Appearance: He is well-developed. He is not diaphoretic.  HENT:     Head: Normocephalic and atraumatic.  Eyes:     General: No scleral icterus.    Conjunctiva/sclera: Conjunctivae normal.    Neck:     Musculoskeletal: Normal range of motion.  Pulmonary:     Effort: Pulmonary effort is normal. No respiratory distress.  Musculoskeletal:       Feet:     Comments: Tenderness to palpation of the right elbow.  Full active and passive range of motion without difficulty.  No overlying erythema, warmth of joint noted.  Area is neurovascularly intact.  2+ radial pulse palpated.  Skin:    Findings: No rash.  Neurological:     Mental Status: He is alert.      ED Treatments / Results  Labs (all labs ordered are listed, but only abnormal results are displayed) Labs Reviewed - No data to display  EKG None  Radiology Dg Foot Complete Left  Result Date: 05/26/2018 CLINICAL DATA:  Lateral foot pain, possible splinter EXAM: LEFT FOOT - COMPLETE 3+ VIEW COMPARISON:  None. FINDINGS: No acute fracture or dislocation is noted. Some mild soft tissue prominence is noted laterally. No definitive radiopaque foreign body is identified. Glass shards are typically lip radiolucent. Small calcaneal spur is seen. IMPRESSION: No definitive radiopaque foreign body is noted. Mild soft tissue swelling laterally. No fracture is seen. Electronically Signed   By: Alcide CleverMark  Lukens M.D.   On: 05/26/2018 19:19    Procedures Procedures (including critical care time)  Medications Ordered in ED Medications - No data to display   Initial Impression / Assessment and Plan / ED Course  I have reviewed the triage vital signs and the nursing notes.  Pertinent labs & imaging results that were available during my care of the patient were reviewed by me and considered in my medical decision making (see chart for details).     28 year old male presents to ED for multiple complaints.  His first complaint is 477-month history of left foot pain.  He feels that he stepped on a splinter like shard of glass while he was cleaning his kitchen 6 months ago.  He states that it could have been longer than 6 months ago but he does  not remember.  He tried to remove the splinter and over the course of the past several months a callus has formed in the area.  On exam there is a callus noted with no signs of secondary infection.  X-ray is negative.  He also complains of right elbow pain for the past month.  No abnormalities noted on exam, no history of trauma.  No signs of septic joint or vascular cause of symptoms. Symptoms most likely due to tendinitis.  We will have him take Tylenol and ibuprofen as needed for discomfort of foot and elbow and have him follow-up with podiatry.  Patient is hemodynamically stable,  in NAD, and able to ambulate in the ED. Evaluation does not show pathology that would require ongoing emergent intervention or inpatient treatment. I explained the diagnosis to the patient. Pain has been managed and has no complaints prior to discharge. Patient is comfortable with above plan and is stable for discharge at this time. All questions were answered prior to disposition. Strict return precautions for returning to the ED were discussed. Encouraged follow up with PCP.    Portions of this note were generated with Scientist, clinical (histocompatibility and immunogenetics). Dictation errors may occur despite best attempts at proofreading.   Final Clinical Impressions(s) / ED Diagnoses   Final diagnoses:  Medial epicondylitis of right elbow  Callus of foot    ED Discharge Orders    None       Dietrich Pates, PA-C 05/26/18 1939    Derwood Kaplan, MD 05/27/18 1116

## 2018-05-26 NOTE — Discharge Instructions (Signed)
Alternate Tylenol and ibuprofen to help with your pain. Follow-up with a podiatrist. Return to ED for worsening symptoms, increased swelling, redness, pus draining from the area.

## 2018-05-26 NOTE — ED Notes (Signed)
Patient verbalizes understanding of discharge instructions. Opportunity for questioning and answers were provided. Armband removed by staff, pt discharged from ED ambulatory to home.  

## 2019-05-29 IMAGING — DX DG FOOT COMPLETE 3+V*L*
3 series · 3 of 3 positions shown · non-contrast
Comparison: None.

CLINICAL DATA: Lateral foot pain, possible splinter

EXAM:
LEFT FOOT - COMPLETE 3+ VIEW

[foot ap]
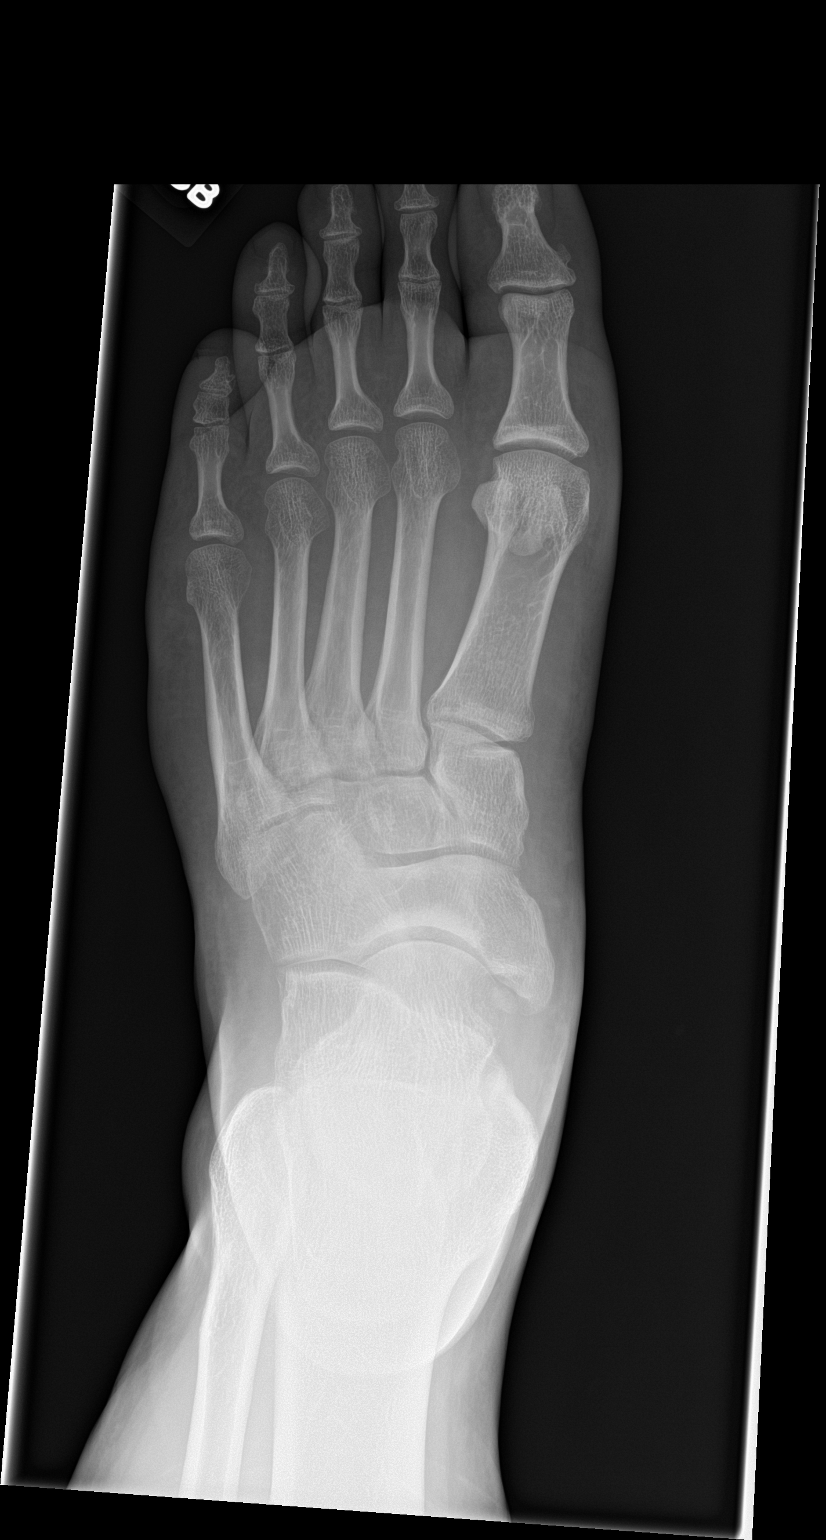

[foot obl]
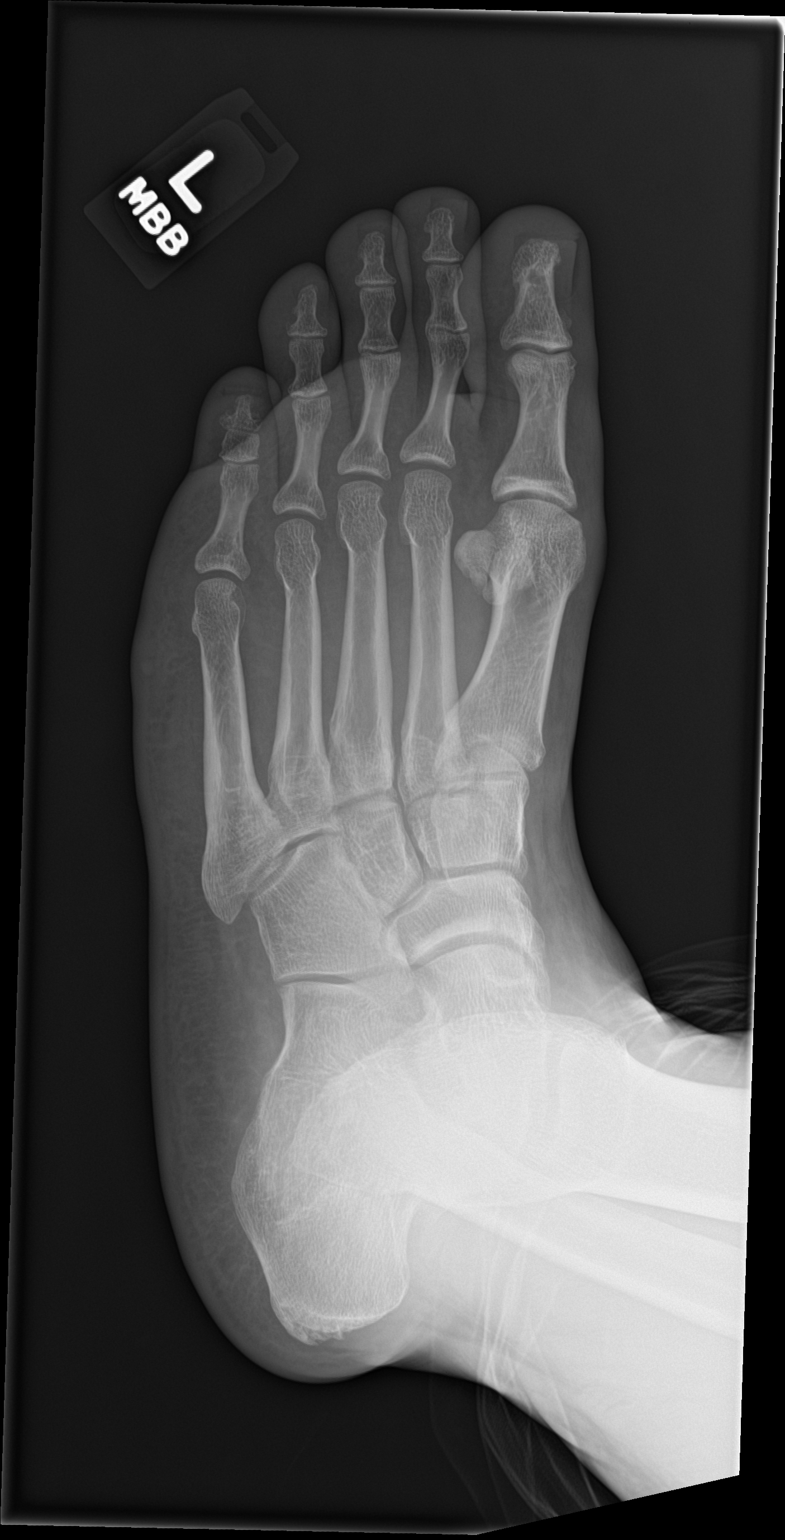

[foot lat]
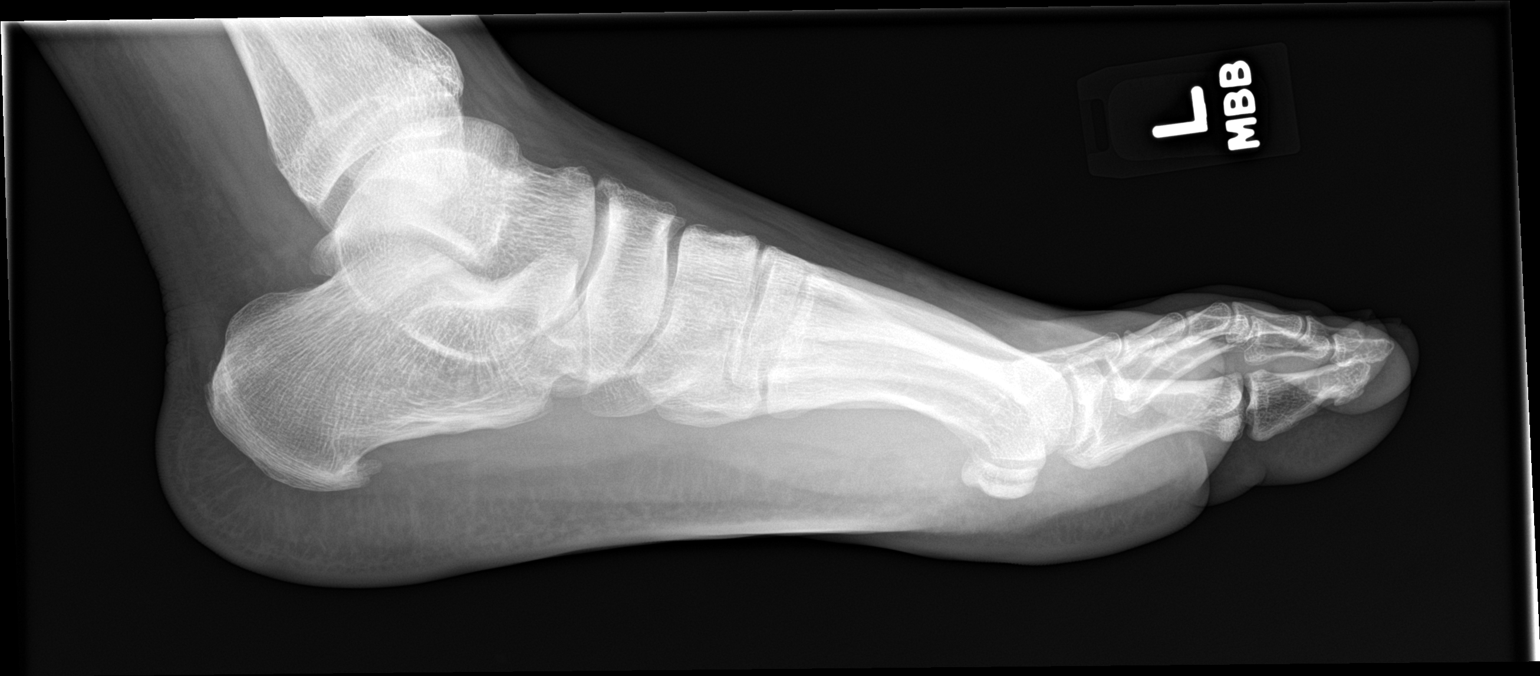

[3 of 3 positions shown; findings below may reference images not displayed]

FINDINGS: No acute fracture or dislocation is noted. Some mild soft tissue
prominence is noted laterally. No definitive radiopaque foreign body
is identified. Glass shards are typically lip radiolucent. Small
calcaneal spur is seen.
IMPRESSION: No definitive radiopaque foreign body is noted.

Mild soft tissue swelling laterally.

No fracture is seen.

## 2022-06-22 ENCOUNTER — Telehealth: Payer: Self-pay

## 2022-06-22 NOTE — Telephone Encounter (Signed)
Mychart msg sent. AS, CMA

## 2022-08-25 DIAGNOSIS — I10 Essential (primary) hypertension: Secondary | ICD-10-CM | POA: Diagnosis not present

## 2022-08-25 DIAGNOSIS — R590 Localized enlarged lymph nodes: Secondary | ICD-10-CM | POA: Diagnosis not present

## 2022-09-06 DIAGNOSIS — Z113 Encounter for screening for infections with a predominantly sexual mode of transmission: Secondary | ICD-10-CM | POA: Diagnosis not present

## 2022-09-06 DIAGNOSIS — Z114 Encounter for screening for human immunodeficiency virus [HIV]: Secondary | ICD-10-CM | POA: Diagnosis not present

## 2023-06-02 ENCOUNTER — Other Ambulatory Visit: Payer: Self-pay

## 2023-06-02 ENCOUNTER — Emergency Department (HOSPITAL_BASED_OUTPATIENT_CLINIC_OR_DEPARTMENT_OTHER)
Admission: EM | Admit: 2023-06-02 | Discharge: 2023-06-02 | Disposition: A | Discharge status: Home / Self Care | Payer: Medicaid Other | Attending: Emergency Medicine | Admitting: Emergency Medicine

## 2023-06-02 ENCOUNTER — Encounter (HOSPITAL_BASED_OUTPATIENT_CLINIC_OR_DEPARTMENT_OTHER): Payer: Self-pay | Admitting: Urology

## 2023-06-02 DIAGNOSIS — Z79899 Other long term (current) drug therapy: Secondary | ICD-10-CM | POA: Diagnosis not present

## 2023-06-02 DIAGNOSIS — Z5321 Procedure and treatment not carried out due to patient leaving prior to being seen by health care provider: Secondary | ICD-10-CM | POA: Diagnosis not present

## 2023-06-02 DIAGNOSIS — I10 Essential (primary) hypertension: Secondary | ICD-10-CM | POA: Insufficient documentation

## 2023-06-02 HISTORY — DX: Essential (primary) hypertension: I10

## 2023-06-02 MED ORDER — AMLODIPINE BESYLATE 5 MG PO TABS: 5.0000 mg | ORAL_TABLET | Freq: Every day | ORAL | 1 refills | Status: DC

## 2023-06-02 MED ORDER — LOSARTAN POTASSIUM 25 MG PO TABS: 25.0000 mg | ORAL_TABLET | Freq: Every day | ORAL | 1 refills | Status: DC

## 2023-06-02 NOTE — Discharge Instructions (Signed)

## 2023-06-02 NOTE — ED Triage Notes (Signed)
Pt states BP was elevated this am at 150 systolically  States woke up with headache  Took amlodipine today, it was his last one, needs refill  No pcp at this time to refill it  Denies any pain

## 2023-06-02 NOTE — ED Provider Notes (Signed)
[place] [facility] Provider Note   CSN: [id number] Arrival date & time: 06/02/23  1928     History  Chief Complaint  Patient presents with   Medication Refill   Hypertension    Jesse Reed is a 33 y.o. male.  Patient with history of hypertension, noncompliant with medications over the past couple of months, presents for evaluation of head pressure.  He also reports being anxious.  He states that he had some "pressure" in his head earlier tonight.  He checked his blood pressure.  He reports being out of amlodipine and losartan over the past 2 months.  He was able to find 1 tablet of amlodipine and an old pill bottle and he took that.  He checked his blood pressure a bit later and it was even higher, in the 170s systolic range.  This was concerning, prompting emergency department visit.  Patient denies chest pain or shortness of breath. Patient denies signs of stroke including: facial droop, slurred speech, aphasia, weakness/numbness in extremities, imbalance/trouble walking.  He does not currently with PCP.       Home Medications Prior to Admission medications   Medication Sig Start Date End Date Taking? Authorizing Provider  amLODipine (NORVASC) 5 MG tablet Take 1 tablet (5 mg total) by mouth daily. 06/02/23 07/02/23 Yes Renne Crigler, PA-C  losartan (COZAAR) 25 MG tablet Take 1 tablet (25 mg total) by mouth daily. 06/02/23  Yes Renne Crigler, PA-C      Allergies    Patient has no known allergies.    Review of Systems   Review of Systems  Physical Exam Updated Vital Signs BP (!) 165/103   Pulse 86   Temp 98.9 F (37.2 C) (Oral)   Resp 18   Ht 5\' 11"  (1.803 m)   Wt 117.9 kg   SpO2 100%   BMI 36.25 kg/m  Physical Exam Vitals and nursing note reviewed.  Constitutional:      Appearance: He is well-developed. He is not diaphoretic.  HENT:     Head: Normocephalic and atraumatic.     Right Ear: Tympanic membrane, ear  canal and external ear normal.     Left Ear: Tympanic membrane, ear canal and external ear normal.     Nose: Nose normal.     Mouth/Throat:     Mouth: Mucous membranes are not dry.     Pharynx: Uvula midline.  Eyes:     General: Lids are normal.     Conjunctiva/sclera: Conjunctivae normal.     Pupils: Pupils are equal, round, and reactive to light.  Neck:     Vascular: Normal carotid pulses. No carotid bruit or JVD.     Trachea: Trachea normal. No tracheal deviation.  Cardiovascular:     Rate and Rhythm: Normal rate and regular rhythm.     Pulses: No decreased pulses.          Radial pulses are 2+ on the right side and 2+ on the left side.     Heart sounds: Normal heart sounds, S1 normal and S2 normal. Heart sounds not distant. No murmur heard. Pulmonary:     Effort: Pulmonary effort is normal. No respiratory distress.     Breath sounds: Normal breath sounds. No wheezing.  Chest:     Chest wall: No tenderness.  Abdominal:     General: Bowel sounds are normal.     Palpations: Abdomen is soft.     Tenderness: There is no abdominal tenderness. There  is no guarding or rebound.  Musculoskeletal:        General: Normal range of motion.     Cervical back: Normal range of motion and neck supple. No tenderness or bony tenderness. No muscular tenderness.     Right lower leg: No edema.     Left lower leg: No edema.  Skin:    General: Skin is warm and dry.     Coloration: Skin is not pale.  Neurological:     Mental Status: He is alert and oriented to person, place, and time. Mental status is at baseline.     GCS: GCS eye subscore is 4. GCS verbal subscore is 5. GCS motor subscore is 6.     Cranial Nerves: No cranial nerve deficit.     Sensory: No sensory deficit.     Motor: No abnormal muscle tone.     Coordination: Coordination normal.     Gait: Gait normal.     Deep Tendon Reflexes: Reflexes are normal and symmetric.  Psychiatric:        Mood and Affect: Mood is anxious.     ED  Results / Procedures / Treatments   Labs (all labs ordered are listed, but only abnormal results are displayed) Labs Reviewed - No data to display  EKG None  Radiology No results found.  Procedures Procedures    Medications Ordered in ED Medications - No data to display  ED Course/ Medical Decision Making/ A&P    Patient seen and examined. History obtained directly from patient.  He looks well, seems a bit anxious.  No focal findings on physical exam.  Labs/EKG: None ordered  Imaging: None ordered  Medications/Fluids: None ordered  Most recent vital signs reviewed and are as follows: BP (!) 165/103   Pulse 86   Temp 98.9 F (37.2 C) (Oral)   Resp 18   Ht 5\' 11"  (1.803 m)   Wt 117.9 kg   SpO2 100%   BMI 36.25 kg/m   Initial impression: Hypertension  Home treatment plan: Restart blood pressure medication, establish PCP follow-up  Return instructions discussed with patient: Patient counseled to return if they have weakness in their arms or legs, slurred speech, trouble walking or talking, confusion, trouble with their balance, or if they have any other concerns.  Also development of chest pain.  Patient verbalizes understanding and agrees with plan.   Follow-up instructions discussed with patient: Follow-up with PCP for recheck of blood pressure and for routine physical and labs.                                Medical Decision Making Risk Prescription drug management.   Well-appearing patient.  Elevated blood pressure, off blood pressure medications.  Normal exam.  Head 'pressure' no focal HA. No CP.   The patient's vital signs, pertinent lab work and imaging were reviewed and interpreted as discussed in the ED course. Hospitalization was considered for further testing, treatments, or serial exams/observation. However as patient is well-appearing, has a stable exam, and reassuring studies today, I do not feel that they warrant admission at this time. This plan  was discussed with the patient who verbalizes agreement and comfort with this plan and seems reliable and able to return to the Emergency Department with worsening or changing symptoms.          Final Clinical Impression(s) / ED Diagnoses Final diagnoses:  Uncontrolled hypertension  Rx / DC Orders ED Discharge Orders          Ordered    amLODipine (NORVASC) 5 MG tablet  Daily        06/02/23 2113    losartan (COZAAR) 25 MG tablet  Daily        06/02/23 2113    Referral to [facility] Care Management       Comments: Pharmacy Medication Management for Uncontrolled hypertension in the ED   06/02/23 2113              Renne Crigler, PA-C 06/02/23 2119    Anders Simmonds T, DO 06/02/23 2244

## 2023-06-02 NOTE — ED Notes (Signed)
D/c paperwork reviewed with pt, including prescriptions and follow up care.  All questions and/or concerns addressed at time of d/c.  No further needs expressed. . Pt verbalized understanding, Ambulatory without assistance to ED exit, NAD.

## 2023-06-06 ENCOUNTER — Telehealth: Payer: Self-pay

## 2023-06-06 NOTE — Progress Notes (Unsigned)
Care Guide Pharmacy Note  06/06/2023 Name: Glenn Gullickson MRN: [id number] DOB: 11-Jun-1990  Referred By: Patient, No Pcp Per Reason for referral: Care Coordination (Outreach to schedule with Pharm d )   Torien Ramroop is a 33 y.o. year old male who is a primary care patient of Patient, No Pcp Per.  Hendrick Black-Matthews was referred to the pharmacist for assistance related to: HTN  An unsuccessful telephone outreach was attempted today to contact the patient who was referred to the pharmacy team for assistance with medication management. Additional attempts will be made to contact the patient.  Penne Lash , RMA     [facility]  [facility], [facility] Guide  Direct Dial: [phone]  Website: [email or web address]

## 2023-06-08 NOTE — Progress Notes (Signed)
Care Guide Pharmacy Note  06/08/2023 Name: Jesse Reed MRN: [id number] DOB: 20-Apr-1991  Referred By: Patient, No Pcp Per Reason for referral: Care Coordination (Outreach to schedule with Pharm d )   [name] is a 33 y.o. year old male who is a primary care patient of Patient, No Pcp Per.  [name] was referred to the pharmacist for assistance related to: HTN  Successful contact was made with the patient to discuss pharmacy services including being ready for the pharmacist to call at least 5 minutes before the scheduled appointment time and to have medication bottles and any blood pressure readings ready for review. The patient agreed to meet with the pharmacist via telephone visit on (date/time).06/16/2023  Penne Lash , RMA     [place]  [facility], [facility] Guide  Direct Dial: [phone]  Website: [email or web address]

## 2023-06-16 ENCOUNTER — Other Ambulatory Visit: Payer: Self-pay

## 2023-06-16 NOTE — Progress Notes (Signed)
   06/16/2023  Patient ID: Jesse Reed, male   DOB: 04/12/1991, 33 y.o.   MRN: [id number]  Contacted patient via telephone to follow up on recent ER visit for HTN.  Patient confirms he is taking medications as directed. Has a BP machine at home but has not logged readings. Checked a couple days ago and it was 150/100.  Complains of intermittent headaches but none the last couple of days. Denies any other sx of high BP and denies any signs of low BP.  Counseled patient to monitor BP at home daily and be mindful of sodium intake. Counseled on importance of physical activity and smoking cessation. Provided number to 1800 QUIT Line. Counseled on ED precautions.  Patient requests a call from [facility] to establish PCP. Forwarding to engineer, manufacturing. If BP still elevated, would consider increase on either amlodipine  or losartan .  Jon VEAR Lindau, PharmD Clinical Pharmacist [phone]

## 2023-06-30 ENCOUNTER — Ambulatory Visit (INDEPENDENT_AMBULATORY_CARE_PROVIDER_SITE_OTHER): Payer: Medicaid Other | Admitting: Nurse Practitioner

## 2023-06-30 ENCOUNTER — Encounter: Payer: Self-pay | Admitting: Nurse Practitioner

## 2023-06-30 VITALS — BP 143/86 | HR 93 | Temp 97.6°F | Wt 268.0 lb

## 2023-06-30 DIAGNOSIS — Z09 Encounter for follow-up examination after completed treatment for conditions other than malignant neoplasm: Secondary | ICD-10-CM | POA: Diagnosis not present

## 2023-06-30 DIAGNOSIS — E66812 Obesity, class 2: Secondary | ICD-10-CM | POA: Diagnosis not present

## 2023-06-30 DIAGNOSIS — Z0000 Encounter for general adult medical examination without abnormal findings: Secondary | ICD-10-CM | POA: Diagnosis not present

## 2023-06-30 DIAGNOSIS — Z6837 Body mass index (BMI) 37.0-37.9, adult: Secondary | ICD-10-CM | POA: Insufficient documentation

## 2023-06-30 DIAGNOSIS — I10 Essential (primary) hypertension: Secondary | ICD-10-CM | POA: Diagnosis not present

## 2023-06-30 DIAGNOSIS — Z87891 Personal history of nicotine dependence: Secondary | ICD-10-CM | POA: Insufficient documentation

## 2023-06-30 MED ORDER — AMLODIPINE BESYLATE 10 MG PO TABS: 10.0000 mg | ORAL_TABLET | Freq: Every day | ORAL | 1 refills | Status: DC

## 2023-06-30 MED ORDER — AMLODIPINE BESYLATE 5 MG PO TABS: 7.5000 mg | ORAL_TABLET | Freq: Every day | ORAL | 1 refills | Status: DC

## 2023-06-30 MED ORDER — LOSARTAN POTASSIUM 25 MG PO TABS: 25.0000 mg | ORAL_TABLET | Freq: Every day | ORAL | 1 refills | Status: DC

## 2023-06-30 NOTE — Assessment & Plan Note (Signed)
 Wt Readings from Last 3 Encounters:  06/30/23 268 lb (121.6 kg)  06/02/23 259 lb 14.8 oz (117.9 kg)  05/26/18 260 lb (117.9 kg)  Body mass index is 37.38 kg/m.  Patient counseled on low-carb diet Encouraged engaging regular moderate exercises at least 150 minutes weekly as tolerated

## 2023-06-30 NOTE — Assessment & Plan Note (Signed)
 Patient congratulated on his efforts at smoking cessation He was encouraged to continue to abstain from smoking

## 2023-06-30 NOTE — Progress Notes (Signed)
 New Patient Office Visit  Subjective:  Patient ID: Jesse Reed, male    DOB: 1990-11-24  Age: 33 y.o. MRN: [id number]  CC:  Chief Complaint  Patient presents with   Establish Care    HPI Jesse Reed is a 33 y.o. of hypertension who presents for establishing care.  Has no previous PCP  Hypertension.  Currently on amlodipine 5 mg daily, losartan 25 mg daily.  Stated that he has been taking both medications daily since his last hospital visit.  Prior to his ER visit he had ran out of both medications. he denies chest pain, shortness of breath, edema.  He has stopped smoking cigarette, was barely smoking a pack of cigarettes daily.  Does not drink alcohol and does not use drugs.    Past Medical History:  Diagnosis Date   Former smoker     History reviewed. No pertinent surgical history.  Family History  Problem Relation Age of Onset   High blood pressure Mother    Stroke Mother    High blood pressure Father    Lung cancer Maternal Grandfather    Heart attack Neg Hx     Social History   Socioeconomic History   Marital status: Media planner    Spouse name: Not on file   Number of children: 1   Years of education: Not on file   Highest education level: Not on file  Occupational History   Not on file  Tobacco Use   Smoking status: Former    Types: Cigars   Smokeless tobacco: Not on file  Substance and Sexual Activity   Alcohol use: No   Drug use: No   Sexual activity: Not Currently  Other Topics Concern   Not on file  Social History Narrative   Lives with his spouse    Social Drivers of Corporate investment banker Strain: Not on file  Food Insecurity: Not on file  Transportation Needs: Not on file  Physical Activity: Not on file  Stress: Not on file  Social Connections: Not on file  Intimate Partner Violence: Not on file    ROS Review of Systems  Constitutional:  Negative for appetite change, chills, fatigue and fever.  HENT:   Negative for congestion, postnasal drip, rhinorrhea and sneezing.   Respiratory:  Negative for cough, shortness of breath and wheezing.   Cardiovascular:  Negative for chest pain, palpitations and leg swelling.  Gastrointestinal:  Negative for abdominal pain, constipation, nausea and vomiting.  Genitourinary:  Negative for difficulty urinating, dysuria, flank pain and frequency.  Musculoskeletal:  Negative for arthralgias, back pain, joint swelling and myalgias.  Skin:  Negative for color change, pallor, rash and wound.  Neurological:  Negative for dizziness, facial asymmetry, weakness, numbness and headaches.  Psychiatric/Behavioral:  Negative for behavioral problems, confusion, self-injury and suicidal ideas.     Objective:   Today's Vitals: BP (!) 143/86   Pulse 93   Temp 97.6 F (36.4 C)   Wt 268 lb (121.6 kg)   SpO2 98%   BMI 37.38 kg/m   Physical Exam Vitals and nursing note reviewed.  Constitutional:      General: He is not in acute distress.    Appearance: Normal appearance. He is obese. He is not ill-appearing, toxic-appearing or diaphoretic.  HENT:     Mouth/Throat:     Mouth: Mucous membranes are moist.     Pharynx: Oropharynx is clear. No oropharyngeal exudate or posterior oropharyngeal erythema.  Eyes:  General: No scleral icterus.       Right eye: No discharge.        Left eye: No discharge.     Extraocular Movements: Extraocular movements intact.     Conjunctiva/sclera: Conjunctivae normal.  Cardiovascular:     Rate and Rhythm: Normal rate and regular rhythm.     Pulses: Normal pulses.     Heart sounds: Normal heart sounds. No murmur heard.    No friction rub. No gallop.  Pulmonary:     Effort: Pulmonary effort is normal. No respiratory distress.     Breath sounds: Normal breath sounds. No stridor. No wheezing, rhonchi or rales.  Chest:     Chest wall: No tenderness.  Abdominal:     General: There is no distension.     Palpations: Abdomen is soft.      Tenderness: There is no abdominal tenderness. There is no right CVA tenderness, left CVA tenderness or guarding.  Musculoskeletal:        General: No swelling, tenderness, deformity or signs of injury.     Right lower leg: No edema.     Left lower leg: No edema.  Skin:    General: Skin is warm and dry.     Capillary Refill: Capillary refill takes less than 2 seconds.     Coloration: Skin is not jaundiced or pale.     Findings: No bruising, erythema or lesion.  Neurological:     Mental Status: He is alert and oriented to person, place, and time.     Motor: No weakness.     Coordination: Coordination normal.     Gait: Gait normal.  Psychiatric:        Mood and Affect: Mood normal.        Behavior: Behavior normal.        Thought Content: Thought content normal.        Judgment: Judgment normal.     Assessment & Plan:   Problem List Items Addressed This Visit       Cardiovascular and Mediastinum   High blood pressure - Primary   Increase amlodipine to 7.5 mg daily, continue losartan 25 mg daily DASH diet and commitment to daily physical activity for a minimum of 30 minutes discussed and encouraged, as a part of hypertension management. The importance of attaining a healthy weight is also discussed. CMP today, follow-up in 4 weeks We will get a baseline EKG at next visit     06/30/2023    2:52 PM 06/30/2023    2:35 PM 06/02/2023    9:00 PM 06/02/2023    7:36 PM 06/02/2023    7:34 PM 05/26/2018    7:42 PM 05/26/2018    5:11 PM  BP/Weight  Systolic BP 143 165 165 150  136 138  Diastolic BP 86 86 103 99  88 91  Wt. (Lbs)  268   259.92    BMI  37.38 kg/m2   36.25 kg/m2             Relevant Medications   losartan (COZAAR) 25 MG tablet   amLODipine (NORVASC) 5 MG tablet     Other   Class 2 severe obesity with serious comorbidity and body mass index (BMI) of 37.0 to 37.9 in adult (HCC)   Wt Readings from Last 3 Encounters:  06/30/23 268 lb (121.6 kg)  06/02/23 259 lb  14.8 oz (117.9 kg)  05/26/18 260 lb (117.9 kg)  Body mass index is 37.38 kg/m.  Patient  counseled on low-carb diet Encouraged engaging regular moderate exercises at least 150 minutes weekly as tolerated      Encounter for examination following treatment at hospital   Hospital chart reviewed, including discharge summary Medications reconciled and reviewed with the patient in detail       Former smoker   Patient congratulated on his efforts at smoking cessation He was encouraged to continue to abstain from smoking      Other Visit Diagnoses       Health care maintenance       Relevant Orders   CBC   CMP14+EGFR       Outpatient Encounter Medications as of 06/30/2023  Medication Sig   [DISCONTINUED] amLODipine (NORVASC) 10 MG tablet Take 1 tablet (10 mg total) by mouth daily.   [DISCONTINUED] amLODipine (NORVASC) 5 MG tablet Take 1 tablet (5 mg total) by mouth daily.   [DISCONTINUED] losartan (COZAAR) 25 MG tablet Take 1 tablet (25 mg total) by mouth daily.   amLODipine (NORVASC) 5 MG tablet Take 1.5 tablets (7.5 mg total) by mouth daily.   losartan (COZAAR) 25 MG tablet Take 1 tablet (25 mg total) by mouth daily.   No facility-administered encounter medications on file as of 06/30/2023.    Follow-up: Return in about 4 weeks (around 07/28/2023) for CPE.   Donell Beers, FNP

## 2023-06-30 NOTE — Assessment & Plan Note (Signed)
 Hospital chart reviewed, including discharge summary Medications reconciled and reviewed with the patient in detail

## 2023-06-30 NOTE — Patient Instructions (Signed)
 1. Primary hypertension (Primary)  - losartan (COZAAR) 25 MG tablet; Take 1 tablet (25 mg total) by mouth daily.  Dispense: 90 tablet; Refill: 1 - amLODipine (NORVASC) 5 MG tablet; Take 1.5 tablets (7.5 mg total) by mouth daily.  Dispense: 90 tablet; Refill: 1  2. Health care maintenance  - CBC - CMP14+EGFR    Around 3 times per week, check your blood pressure 2 times per day. once in the morning and once in the evening. The readings should be at least one minute apart. Write down these values and bring them to your next nurse visit/appointment.  When you check your BP, make sure you have been doing something calm/relaxing 5 minutes prior to checking. Both feet should be flat on the floor and you should be sitting. Use your left arm and make sure it is in a relaxed position (on a table), and that the cuff is at the approximate level/height of your heart. Keep a log and bring to next visit     It is important that you exercise regularly at least 30 minutes 5 times a week as tolerated  Think about what you will eat, plan ahead. Choose " clean, green, fresh or frozen" over canned, processed or packaged foods which are more sugary, salty and fatty. 70 to 75% of food eaten should be vegetables and fruit. Three meals at set times with snacks allowed between meals, but they must be fruit or vegetables. Aim to eat over a 12 hour period , example 7 am to 7 pm, and STOP after  your last meal of the day. Drink water,generally about 64 ounces per day, no other drink is as healthy. Fruit juice is best enjoyed in a healthy way, by EATING the fruit.  Thanks for choosing [facility] we consider it a privelige to serve you.

## 2023-06-30 NOTE — Assessment & Plan Note (Addendum)
 Increase amlodipine to 7.5 mg daily, continue losartan 25 mg daily DASH diet and commitment to daily physical activity for a minimum of 30 minutes discussed and encouraged, as a part of hypertension management. The importance of attaining a healthy weight is also discussed. CMP today, follow-up in 4 weeks We will get a baseline EKG at next visit     06/30/2023    2:52 PM 06/30/2023    2:35 PM 06/02/2023    9:00 PM 06/02/2023    7:36 PM 06/02/2023    7:34 PM 05/26/2018    7:42 PM 05/26/2018    5:11 PM  BP/Weight  Systolic BP 143 165 165 150  136 138  Diastolic BP 86 86 103 99  88 91  Wt. (Lbs)  268   259.92    BMI  37.38 kg/m2   36.25 kg/m2

## 2023-07-01 LAB — CBC
Hematocrit: 42.9 % (ref 37.5–51.0)
Hemoglobin: 14 g/dL (ref 13.0–17.7)
MCH: 25.6 pg — ABNORMAL LOW (ref 26.6–33.0)
MCHC: 32.6 g/dL (ref 31.5–35.7)
MCV: 79 fL (ref 79–97)
Platelets: 272 10*3/uL (ref 150–450)
RBC: 5.46 x10E6/uL (ref 4.14–5.80)
RDW: 12.3 % (ref 11.6–15.4)
WBC: 6.6 10*3/uL (ref 3.4–10.8)

## 2023-07-01 LAB — CMP14+EGFR
ALT: 45 IU/L — ABNORMAL HIGH (ref 0–44)
AST: 29 IU/L (ref 0–40)
Albumin: 4.7 g/dL (ref 4.1–5.1)
Alkaline Phosphatase: 92 IU/L (ref 44–121)
BUN/Creatinine Ratio: 12 (ref 9–20)
BUN: 13 mg/dL (ref 6–20)
Bilirubin Total: 0.4 mg/dL (ref 0.0–1.2)
CO2: 23 mmol/L (ref 20–29)
Calcium: 9.8 mg/dL (ref 8.7–10.2)
Chloride: 102 mmol/L (ref 96–106)
Creatinine, Ser: 1.1 mg/dL (ref 0.76–1.27)
Globulin, Total: 2.9 g/dL (ref 1.5–4.5)
Glucose: 115 mg/dL — ABNORMAL HIGH (ref 70–99)
Potassium: 4 mmol/L (ref 3.5–5.2)
Sodium: 140 mmol/L (ref 134–144)
Total Protein: 7.6 g/dL (ref 6.0–8.5)
eGFR: 91 mL/min/{1.73_m2} (ref >59)

## 2023-07-04 ENCOUNTER — Ambulatory Visit: Payer: Self-pay | Admitting: Nurse Practitioner

## 2023-07-04 NOTE — Telephone Encounter (Signed)
 Copied from CRM [id number]. Topic: Clinical - Lab/Test Results >> Jul 04, 2023 12:07 PM Dimitri Ped wrote: Reason for CRM: patient is returning a call concerning labs . Relayed labs information . Patient is wanting to know what does stable labs mean in medical term  Reason for Disposition  Health Information question, no triage required and triager able to answer question    Pt wanted to discuss lab results from 06/30/2023 & get education r/t HTN  Answer Assessment - Initial Assessment Questions 1. REASON FOR CALL or QUESTION: "What is your reason for calling today?" or "How can I best help you?" or "What question do you have that I can help answer?"     Pt wanted to go over to discuss lab results 06/30/2023: informed pt that labs were Stable labs: informed pt what stable meant.  Also discussed pt BP at last visit due to him questioning his High BP: and provided education and advice r/t nutrition, diet, exercise r/t HTN  Protocols used: Information Only Call - No Triage-A-AH

## 2023-07-28 ENCOUNTER — Ambulatory Visit: Payer: Self-pay | Admitting: Nurse Practitioner

## 2023-10-11 ENCOUNTER — Ambulatory Visit (HOSPITAL_BASED_OUTPATIENT_CLINIC_OR_DEPARTMENT_OTHER)
Admission: EM | Admit: 2023-10-11 | Discharge: 2023-10-11 | Disposition: A | Discharge status: Home / Self Care | Attending: Family Medicine | Admitting: Family Medicine

## 2023-10-11 ENCOUNTER — Encounter (HOSPITAL_BASED_OUTPATIENT_CLINIC_OR_DEPARTMENT_OTHER): Payer: Self-pay | Admitting: Emergency Medicine

## 2023-10-11 ENCOUNTER — Ambulatory Visit (HOSPITAL_BASED_OUTPATIENT_CLINIC_OR_DEPARTMENT_OTHER)
Admit: 2023-10-11 | Discharge: 2023-10-11 | Disposition: A | Discharge status: Home / Self Care | Attending: Family Medicine | Admitting: Family Medicine

## 2023-10-11 DIAGNOSIS — J321 Chronic frontal sinusitis: Secondary | ICD-10-CM

## 2023-10-11 DIAGNOSIS — R051 Acute cough: Secondary | ICD-10-CM | POA: Diagnosis not present

## 2023-10-11 DIAGNOSIS — H6123 Impacted cerumen, bilateral: Secondary | ICD-10-CM | POA: Diagnosis not present

## 2023-10-11 DIAGNOSIS — J209 Acute bronchitis, unspecified: Secondary | ICD-10-CM | POA: Diagnosis not present

## 2023-10-11 DIAGNOSIS — R059 Cough, unspecified: Secondary | ICD-10-CM | POA: Diagnosis not present

## 2023-10-11 MED ORDER — ALBUTEROL SULFATE HFA 108 (90 BASE) MCG/ACT IN AERS: 2.0000 | INHALATION_SPRAY | RESPIRATORY_TRACT | 0 refills | Status: AC | PRN

## 2023-10-11 MED ORDER — COMPACT SPACE CHAMBER DEVI: 0 refills | Status: AC

## 2023-10-11 MED ORDER — IPRATROPIUM-ALBUTEROL 0.5-2.5 (3) MG/3ML IN SOLN
3.0000 mL | Freq: Once | RESPIRATORY_TRACT | Status: AC
  Administered 2023-10-11: 3 mL via RESPIRATORY_TRACT

## 2023-10-11 MED ORDER — DOXYCYCLINE HYCLATE 100 MG PO CAPS: 100.0000 mg | ORAL_CAPSULE | Freq: Two times a day (BID) | ORAL | 0 refills | Status: AC

## 2023-10-11 NOTE — Discharge Instructions (Addendum)
 Sinusitis and cough: Breath sounds improved with DuoNeb treatment.  Chest x-ray was negative.  Will treat for sinusitis but add an inhaler for the respiratory symptoms.  Doxycycline 100 mg twice daily for 10 days.  Albuterol inhaler with spacer, 2 puffs every 4 hours if needed for chest tightness, cough or wheezing.  Get plenty of fluids and rest.  Follow-up if symptoms do not improve, worsen or new symptoms occur.   There is hard wax in your ear(s).  It should be softened before someone tries to rinse out the ears.  Use Colace gelcaps (an over-the-counter stool softener).  Use a large needle or a safety pin to poke a hole in 1 part of the gelcap.  Squeeze 1 or 2 gelcaps into an ear canal and then put a cottonball in the ear.  Do this to both ears if both ears have wax.  Do this at night prior to sleep (for 2-4 nights) and just prior to a planned visit for ear cleaning.  This will soften the wax so that it is easy to clean your ears out.

## 2023-10-11 NOTE — ED Provider Notes (Signed)
 [facility]    CSN: [id number] Arrival date & time: 10/11/23  1306      History   Chief Complaint Chief Complaint  Patient presents with   Nasal Congestion    HPI Jesse Reed is a 33 y.o. male.   Patient reports sinus pressure and some pain since 10/08/2023.  He is coughing and sometimes he feels like he gets up a little mucus looks usually a dark green.  He feels tired and fatigued.  He is not aware of wheezing or any difficulty breathing but he does sometimes feel little tight in his chest with a deep breath.  He has a low-grade fever of 99.4 oral today.  He has taken over-the-counter Advil  and that has helped a little bit with his feeling tired and not feeling well but has not stopped the cough.  He denies nausea, vomiting, diarrhea, constipation.     Past Medical History:  Diagnosis Date   Former smoker     Patient Active Problem List   Diagnosis Date Noted   High blood pressure 06/30/2023   Class 2 severe obesity with serious comorbidity and body mass index (BMI) of 37.0 to 37.9 in adult [facility]) 06/30/2023   Encounter for examination following treatment at hospital 06/30/2023   Former smoker     History reviewed. No pertinent surgical history.     Home Medications    Prior to Admission medications   Medication Sig Start Date End Date Taking? Authorizing Provider  albuterol (VENTOLIN HFA) 108 (90 Base) MCG/ACT inhaler Inhale 2 puffs into the lungs every 4 (four) hours as needed for wheezing or shortness of breath. 10/11/23  Yes Guss Legacy, FNP  amLODipine  (NORVASC ) 5 MG tablet Take 1.5 tablets (7.5 mg total) by mouth daily. 06/30/23 10/11/23 Yes Paseda, Folashade R, FNP  doxycycline (VIBRAMYCIN) 100 MG capsule Take 1 capsule (100 mg total) by mouth 2 (two) times daily. 10/11/23  Yes Guss Legacy, FNP  losartan  (COZAAR ) 25 MG tablet Take 1 tablet (25 mg total) by mouth daily. 06/30/23  Yes Paseda, Folashade R, FNP  Spacer/Aero-Holding Chambers (COMPACT  SPACE CHAMBER) DEVI Use with the albuterol inhaler 10/11/23  Yes Guss Legacy, FNP    Family History Family History  Problem Relation Age of Onset   High blood pressure Mother    Stroke Mother    High blood pressure Father    Lung cancer Maternal Grandfather    Heart attack Neg Hx     Social History Social History   Tobacco Use   Smoking status: Former    Types: Cigars  Substance Use Topics   Alcohol use: No   Drug use: No     Allergies   Patient has no known allergies.   Review of Systems Review of Systems  Constitutional:  Negative for chills and fever.  HENT:  Positive for congestion, postnasal drip, rhinorrhea, sinus pressure and sinus pain. Negative for ear pain and sore throat.   Eyes:  Negative for pain and visual disturbance.  Respiratory:  Positive for cough and shortness of breath.   Cardiovascular:  Negative for chest pain and palpitations.  Gastrointestinal:  Negative for abdominal pain, constipation, diarrhea, nausea and vomiting.  Genitourinary:  Negative for dysuria and hematuria.  Musculoskeletal:  Negative for arthralgias and back pain.  Skin:  Negative for color change and rash.  Neurological:  Negative for seizures and syncope.  All other systems reviewed and are negative.    Physical Exam Triage Vital Signs ED Triage Vitals  Encounter Vitals Group     BP 10/11/23 1317 (!) 147/94     Systolic BP Percentile --      Diastolic BP Percentile --      Pulse Rate 10/11/23 1317 80     Resp 10/11/23 1317 18     Temp 10/11/23 1317 99.4 F (37.4 C)     Temp Source 10/11/23 1317 Oral     SpO2 10/11/23 1317 97 %     Weight --      Height --      Head Circumference --      Peak Flow --      Pain Score 10/11/23 1316 5     Pain Loc --      Pain Education --      Exclude from Growth Chart --    No data found.  Updated Vital Signs BP (!) 147/94 (BP Location: Right Arm)   Pulse 80   Temp 99.4 F (37.4 C) (Oral)   Resp 18   SpO2 97%   Visual  Acuity Right Eye Distance:   Left Eye Distance:   Bilateral Distance:    Right Eye Near:   Left Eye Near:    Bilateral Near:     Physical Exam Vitals and nursing note reviewed.  Constitutional:      General: He is not in acute distress.    Appearance: He is well-developed. He is not ill-appearing or toxic-appearing.  HENT:     Head: Normocephalic and atraumatic.     Right Ear: Hearing and external ear normal. There is impacted cerumen (Canal completely obstructed.  TM not visible.).     Left Ear: Hearing, tympanic membrane and external ear normal. There is impacted cerumen (Canal partially obstructed.  TM is visible and normal.).     Nose: Mucosal edema, congestion and rhinorrhea present. Rhinorrhea is clear.     Right Sinus: Maxillary sinus tenderness and frontal sinus tenderness present.     Left Sinus: Maxillary sinus tenderness and frontal sinus tenderness present.     Comments: Sinus pressure pain is mild    Mouth/Throat:     Lips: Pink.     Mouth: Mucous membranes are moist.     Pharynx: Uvula midline. No oropharyngeal exudate or posterior oropharyngeal erythema.     Tonsils: No tonsillar exudate.  Eyes:     Conjunctiva/sclera: Conjunctivae normal.     Pupils: Pupils are equal, round, and reactive to light.  Cardiovascular:     Rate and Rhythm: Normal rate and regular rhythm.     Heart sounds: S1 normal and S2 normal. No murmur heard. Pulmonary:     Effort: Pulmonary effort is normal. No respiratory distress.     Breath sounds: Examination of the right-upper field reveals decreased breath sounds. Examination of the left-upper field reveals decreased breath sounds. Examination of the right-middle field reveals decreased breath sounds. Examination of the left-middle field reveals decreased breath sounds. Examination of the right-lower field reveals decreased breath sounds. Examination of the left-lower field reveals decreased breath sounds. Decreased breath sounds present. No  wheezing, rhonchi or rales.     Comments: Reassessment after DuoNeb treatment: Initially breath sounds were diminished throughout but no wheezing.  Oxygen saturation was 97% on room air.  After DuoNeb treatment: Oxygen saturation on room air after treatment was 98 to 99%.  Breath sounds were clear and more audible throughout. Abdominal:     General: Abdomen is flat. Bowel sounds are normal.     Palpations:  Abdomen is soft.     Tenderness: There is no abdominal tenderness. There is no right CVA tenderness, left CVA tenderness, guarding or rebound. Negative signs include Murphy's sign, Rovsing's sign and McBurney's sign.  Musculoskeletal:        General: No swelling.     Cervical back: Neck supple.  Lymphadenopathy:     Head:     Right side of head: No submental, submandibular, tonsillar, preauricular or posterior auricular adenopathy.     Left side of head: No submental, submandibular, tonsillar, preauricular or posterior auricular adenopathy.     Cervical: No cervical adenopathy.     Right cervical: No superficial cervical adenopathy.    Left cervical: No superficial cervical adenopathy.  Skin:    General: Skin is warm and dry.     Capillary Refill: Capillary refill takes less than 2 seconds.     Findings: No rash.  Neurological:     Mental Status: He is alert and oriented to person, place, and time.  Psychiatric:        Mood and Affect: Mood normal.      UC Treatments / Results  Labs (all labs ordered are listed, but only abnormal results are displayed) Labs Reviewed - No data to display  EKG   Radiology DG Chest 2 View Result Date: 10/11/2023 CLINICAL DATA:  Cough. EXAM: CHEST - 2 VIEW COMPARISON:  05/13/2023 FINDINGS: Normal sized heart. Clear lungs with normal vascularity. Mild-to-moderate thoracic spine degenerative changes. IMPRESSION: No acute abnormality. Electronically Signed   By: Catherin Closs M.D.   On: 10/11/2023 14:20    Procedures Procedures (including critical  care time)  Medications Ordered in UC Medications  ipratropium-albuterol (DUONEB) 0.5-2.5 (3) MG/3ML nebulizer solution 3 mL (3 mLs Nebulization Given 10/11/23 1413)    Initial Impression / Assessment and Plan / UC Course  I have reviewed the triage vital signs and the nursing notes.  Pertinent labs & imaging results that were available during my care of the patient were reviewed by me and considered in my medical decision making (see chart for details).  Plan of Care: Sinusitis and cough: Encouraged sinus rinses.  Doxycycline 100 mg twice daily for 10 days.    Acute Bronchitis:  Chest x-ray was negative.  Breath sounds did improve with use of the DuoNeb treatment.  Use albuterol inhaler with spacer, 2 puffs every 4 hours if needed for persistent cough, shortness of breath or wheezing.  Cerumen impaction right ear worse than left ear: Provided information on how to soften up his earwax that he can return here or go to primary care and have his ears lavage.  Follow-up if symptoms do not improve, worsen or new symptoms occur.  I reviewed the plan of care with the patient and/or the patient's guardian.  The patient and/or guardian had time to ask questions and acknowledged that the questions were answered.  I provided instruction on symptoms or reasons to return here or to go to an ER, if symptoms/condition did not improve, worsened or if new symptoms occurred.  Final Clinical Impressions(s) / UC Diagnoses   Final diagnoses:  Acute cough  Bilateral impacted cerumen  Chronic frontal sinusitis  Acute bronchitis, unspecified organism     Discharge Instructions      Sinusitis and cough: Breath sounds improved with DuoNeb treatment.  Chest x-ray was negative.  Will treat for sinusitis but add an inhaler for the respiratory symptoms.  Doxycycline 100 mg twice daily for 10 days.  Albuterol inhaler with  spacer, 2 puffs every 4 hours if needed for chest tightness, cough or wheezing.  Get plenty  of fluids and rest.  Follow-up if symptoms do not improve, worsen or new symptoms occur.   There is hard wax in your ear(s).  It should be softened before someone tries to rinse out the ears.  Use Colace gelcaps (an over-the-counter stool softener).  Use a large needle or a safety pin to poke a hole in 1 part of the gelcap.  Squeeze 1 or 2 gelcaps into an ear canal and then put a cottonball in the ear.  Do this to both ears if both ears have wax.  Do this at night prior to sleep (for 2-4 nights) and just prior to a planned visit for ear cleaning.  This will soften the wax so that it is easy to clean your ears out.    ED Prescriptions     Medication Sig Dispense Auth. Provider   albuterol (VENTOLIN HFA) 108 (90 Base) MCG/ACT inhaler Inhale 2 puffs into the lungs every 4 (four) hours as needed for wheezing or shortness of breath. 1 each Guss Legacy, FNP   Spacer/Aero-Holding Chambers (COMPACT SPACE CHAMBER) DEVI Use with the albuterol inhaler 1 each Guss Legacy, FNP   doxycycline (VIBRAMYCIN) 100 MG capsule Take 1 capsule (100 mg total) by mouth 2 (two) times daily. 20 capsule Guss Legacy, FNP      PDMP not reviewed this encounter.   Guss Legacy, FNP 10/11/23 1500

## 2023-10-11 NOTE — ED Triage Notes (Signed)
 Pt c/o sinus pressure and pain some coughing x 3 days ago. Pt feels fatigued. Pt has taken OTC Advil  for multi symptom.

## 2024-01-16 ENCOUNTER — Other Ambulatory Visit: Payer: Self-pay | Admitting: Nurse Practitioner

## 2024-01-16 DIAGNOSIS — I10 Essential (primary) hypertension: Secondary | ICD-10-CM

## 2024-03-11 ENCOUNTER — Other Ambulatory Visit: Payer: Self-pay | Admitting: Nurse Practitioner

## 2024-03-11 DIAGNOSIS — I10 Essential (primary) hypertension: Secondary | ICD-10-CM

## 2024-03-11 MED ORDER — AMLODIPINE BESYLATE 5 MG PO TABS: 7.5000 mg | ORAL_TABLET | Freq: Every day | ORAL | 1 refills | Status: AC

## 2024-03-12 ENCOUNTER — Encounter: Payer: Self-pay | Admitting: Nurse Practitioner
